# Patient Record
Sex: Male | Born: 1980 | Race: White | Hispanic: No | Marital: Single | Smoking: Current every day smoker
Health system: Southern US, Community
[De-identification: ages and names within clinical notes are randomized; demographics above are authoritative.]

## PROBLEM LIST (undated history)

## (undated) DIAGNOSIS — F319 Bipolar disorder, unspecified: Secondary | ICD-10-CM

---

## 2015-03-06 ENCOUNTER — Ambulatory Visit (INDEPENDENT_AMBULATORY_CARE_PROVIDER_SITE_OTHER): Payer: Self-pay | Admitting: Urgent Care

## 2015-03-06 ENCOUNTER — Encounter: Payer: Self-pay | Admitting: Urgent Care

## 2015-03-06 VITALS — BP 110/70 | HR 79 | Temp 98.3°F | Resp 16 | Ht 69.0 in | Wt 153.4 lb

## 2015-03-06 DIAGNOSIS — Z021 Encounter for pre-employment examination: Secondary | ICD-10-CM

## 2015-03-06 DIAGNOSIS — Z024 Encounter for examination for driving license: Secondary | ICD-10-CM

## 2015-03-06 NOTE — Patient Instructions (Signed)

## 2015-03-06 NOTE — Progress Notes (Signed)
Airline pilot Medical Examination   Mitchell Mckee is a 35 y.o. male who presents today for a DOT physical exam. The patient reports no problems. Patient smokes 1ppd, denies chest pain, shob, wheezing, history of COPD.  The following portions of the patient's history were reviewed and updated as appropriate: allergies, current medications, past family history, past medical history, past social history and past surgical history.  Objective:   BP 110/70 mmHg  Pulse 79  Temp(Src) 98.3 F (36.8 C) (Oral)  Resp 16  Ht  (1.753 m)  Wt 153 lb 6.4 oz (69.582 kg)  BMI 22.64 kg/m2  SpO2 98%  Vision/hearing:  Visual Acuity Screening   Right eye Left eye Both eyes  Without correction:  With correction:     Comments: Peripheral vision 85 degrees both eyes. Color vision: Normal 6/6.  Hearing Screening Comments: Whisper test both ears at 10 feet.  Applicant can recognize and distinguish among traffic control signals and devices showing standard red, green, and amber colors.  Corrective lenses required: No  Monocular Vision?: No  Hearing aid requirement: No  Physical Exam  Constitutional: He is oriented to person, place, and time. He appears well-developed and well-nourished.  HENT:  TM's intact bilaterally, no effusions or erythema. Nasal turbinates pink and moist, nasal passages patent. No sinus tenderness. Oropharynx clear, mucous membranes moist, dentition in good repair.  Eyes: Conjunctivae and EOM are normal. Pupils are equal, round, and reactive to light. Right eye exhibits no discharge. Left eye exhibits no discharge. No scleral icterus.  Neck: Normal range of motion. Neck supple. No thyromegaly present.  Cardiovascular: Normal rate, regular rhythm and intact distal pulses.  Exam reveals no gallop and no friction rub.   No murmur heard. Pulmonary/Chest: No stridor. No respiratory distress. He has no wheezes. He has no rales.  Abdominal: Soft. Bowel  sounds are normal. He exhibits no distension and no mass. There is no tenderness.  Musculoskeletal: Normal range of motion. He exhibits no edema or tenderness.  Lymphadenopathy:    He has no cervical adenopathy.  Neurological: He is alert and oriented to person, place, and time. He has normal reflexes. Coordination normal.  Skin: Skin is warm and dry. No rash noted. No erythema. No pallor.  Psychiatric: He has a normal mood and affect.    Labs: Comments: Specific Gr: 1.025; Blood-neg; Protein-trace; Glucose-neg  Assessment:    Healthy male exam.  Meets standards in 14 CFR 391.41;  qualifies for 2 year certificate.    Plan:    Medical examiners certificate completed and printed. Return as needed.

## 2016-07-28 ENCOUNTER — Emergency Department (HOSPITAL_COMMUNITY)
Admission: EM | Admit: 2016-07-28 | Discharge: 2016-07-30 | Disposition: A | Discharge status: Other Acute Inpatient Hospital | Payer: 59 | Attending: Emergency Medicine | Admitting: Emergency Medicine

## 2016-07-28 DIAGNOSIS — F3113 Bipolar disorder, current episode manic without psychotic features, severe: Secondary | ICD-10-CM | POA: Insufficient documentation

## 2016-07-28 DIAGNOSIS — F17210 Nicotine dependence, cigarettes, uncomplicated: Secondary | ICD-10-CM | POA: Insufficient documentation

## 2016-07-28 DIAGNOSIS — F1220 Cannabis dependence, uncomplicated: Secondary | ICD-10-CM | POA: Insufficient documentation

## 2016-07-28 LAB — COMPREHENSIVE METABOLIC PANEL
ALK PHOS: 47 U/L (ref 38–126)
ALT: 17 U/L (ref 17–63)
ANION GAP: 12 (ref 5–15)
AST: 27 U/L (ref 15–41)
Albumin: 5.1 g/dL — ABNORMAL HIGH (ref 3.5–5.0)
BILIRUBIN TOTAL: 1 mg/dL (ref 0.3–1.2)
BUN: 19 mg/dL (ref 6–20)
CALCIUM: 9.4 mg/dL (ref 8.9–10.3)
CO2: 24 mmol/L (ref 22–32)
CREATININE: 1.22 mg/dL (ref 0.61–1.24)
Chloride: 107 mmol/L (ref 101–111)
GFR calc non Af Amer: >60 mL/min (ref >60)
Glucose, Bld: 130 mg/dL — ABNORMAL HIGH (ref 65–99)
Potassium: 3.5 mmol/L (ref 3.5–5.1)
Sodium: 143 mmol/L (ref 135–145)
TOTAL PROTEIN: 7.7 g/dL (ref 6.5–8.1)

## 2016-07-28 LAB — CBC WITH DIFFERENTIAL/PLATELET
BASOS PCT: 0 %
Basophils Absolute: 0.1 10*3/uL (ref 0.0–0.1)
EOS ABS: 0.2 10*3/uL (ref 0.0–0.7)
EOS PCT: 1 %
HCT: 45.5 % (ref 39.0–52.0)
HEMOGLOBIN: 16 g/dL (ref 13.0–17.0)
Lymphocytes Relative: 17 %
Lymphs Abs: 2.4 10*3/uL (ref 0.7–4.0)
MCH: 30.8 pg (ref 26.0–34.0)
MCHC: 35.2 g/dL (ref 30.0–36.0)
MCV: 87.5 fL (ref 78.0–100.0)
MONOS PCT: 10 %
Monocytes Absolute: 1.3 10*3/uL — ABNORMAL HIGH (ref 0.1–1.0)
NEUTROS PCT: 72 %
Neutro Abs: 9.8 10*3/uL — ABNORMAL HIGH (ref 1.7–7.7)
PLATELETS: 262 10*3/uL (ref 150–400)
RBC: 5.2 MIL/uL (ref 4.22–5.81)
RDW: 13.2 % (ref 11.5–15.5)
WBC: 13.7 10*3/uL — AB (ref 4.0–10.5)

## 2016-07-28 LAB — ETHANOL: Alcohol, Ethyl (B): <5 mg/dL (ref <5)

## 2016-07-28 MED ORDER — ZIPRASIDONE MESYLATE 20 MG IM SOLR
10.0000 mg | Freq: Once | INTRAMUSCULAR | Status: AC
  Administered 2016-07-28: 10 mg via INTRAMUSCULAR
  Filled 2016-07-28: qty 20

## 2016-07-28 MED ORDER — STERILE WATER FOR INJECTION IJ SOLN
INTRAMUSCULAR | Status: AC
  Administered 2016-07-28
  Filled 2016-07-28: qty 10

## 2016-07-28 NOTE — ED Triage Notes (Signed)
Pt behavior change over the last several days very focused on video games. Pt has not slept for several days currently hyper verbal with flight of ideas.

## 2016-07-29 DIAGNOSIS — F1220 Cannabis dependence, uncomplicated: Secondary | ICD-10-CM | POA: Diagnosis present

## 2016-07-29 DIAGNOSIS — F3113 Bipolar disorder, current episode manic without psychotic features, severe: Secondary | ICD-10-CM | POA: Diagnosis present

## 2016-07-29 LAB — RAPID URINE DRUG SCREEN, HOSP PERFORMED
AMPHETAMINES: NOT DETECTED
BENZODIAZEPINES: NOT DETECTED
Barbiturates: NOT DETECTED
Cocaine: NOT DETECTED
Opiates: NOT DETECTED
TETRAHYDROCANNABINOL: POSITIVE — AB

## 2016-07-29 LAB — URINALYSIS, ROUTINE W REFLEX MICROSCOPIC
Bilirubin Urine: NEGATIVE
GLUCOSE, UA: NEGATIVE mg/dL
HGB URINE DIPSTICK: NEGATIVE
Ketones, ur: 20 mg/dL — AB
Leukocytes, UA: NEGATIVE
NITRITE: NEGATIVE
PROTEIN: 30 mg/dL — AB
SPECIFIC GRAVITY, URINE: 1.032 — AB (ref 1.005–1.030)
Squamous Epithelial / LPF: NONE SEEN
pH: 5 (ref 5.0–8.0)

## 2016-07-29 LAB — TSH: TSH: 1.648 u[IU]/mL (ref 0.350–4.500)

## 2016-07-29 MED ORDER — RISPERIDONE 0.5 MG PO TABS
0.5000 mg | ORAL_TABLET | Freq: Two times a day (BID) | ORAL | Status: DC
  Administered 2016-07-29 – 2016-07-30 (×3): 0.5 mg via ORAL
  Filled 2016-07-29 (×3): qty 1

## 2016-07-29 MED ORDER — NICOTINE 21 MG/24HR TD PT24
21.0000 mg | MEDICATED_PATCH | Freq: Every day | TRANSDERMAL | Status: DC
  Administered 2016-07-29 – 2016-07-30 (×2): 21 mg via TRANSDERMAL
  Filled 2016-07-29 (×2): qty 1

## 2016-07-29 MED ORDER — GABAPENTIN 100 MG PO CAPS
200.0000 mg | ORAL_CAPSULE | Freq: Two times a day (BID) | ORAL | Status: DC
  Administered 2016-07-29 – 2016-07-30 (×3): 200 mg via ORAL
  Filled 2016-07-29 (×3): qty 2

## 2016-07-29 MED ORDER — HYDROXYZINE HCL 25 MG PO TABS
50.0000 mg | ORAL_TABLET | Freq: Three times a day (TID) | ORAL | Status: DC | PRN
  Administered 2016-07-29 – 2016-07-30 (×2): 50 mg via ORAL
  Filled 2016-07-29 (×2): qty 2

## 2016-07-29 MED ORDER — ASENAPINE MALEATE 5 MG SL SUBL
10.0000 mg | SUBLINGUAL_TABLET | Freq: Two times a day (BID) | SUBLINGUAL | Status: DC | PRN
  Administered 2016-07-29: 10 mg via SUBLINGUAL
  Filled 2016-07-29: qty 2

## 2016-07-29 NOTE — ED Notes (Signed)
Pt compliant with prescribed medication regimen. Pt speech pressured, tangential, rapid, flight of ideas. Pt restless. Encouragement and support provided. Special checks q 15 mins in place for safety, Video monitoring in place. Will continue to monitor.

## 2016-07-29 NOTE — Progress Notes (Signed)
07/29/2016 Approximately 1:20 LRT asked pt if he would like to participate in an activity in the dayroom, pt was interested. Pt joined LRT and intern in dayroom. LRT took out cards and [place], but pt wanted to talk about what brought him in here. Pt was hyper verbal and manic. Pt reported he should not be in here and that he was setting up his life for his girlfriend and his future baby. Pt reported he sent a text to his girlfriend's mother and father, but did not specify what it had said, but they showed up at his house. Pt reported that he came here for a drug test because his friends and family believe he is using drugs and then he ended up in here. Pt reported that he only smokes marijuana. Pt appeared aggravated with his family and reports that he is lonely. 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 Pt reports he is ready to go home and he will continue to "agro" the staff. Pt spoke to LRT and intern and LRT suggested that he should speak to his nurse to find out what the plans are for him regarding his discharge. Pt was respectful with LRT and intern, but spent his session speaking about his current situation and his emotions regarding the position he is in.  Marvell Fullerachel Meyer, Recreation Therapy Intern  Mitchell RancherMarjette Cheryel Mckee, LRT/CTRS

## 2016-07-29 NOTE — ED Notes (Signed)
Pt observed in dayroom with RT.  

## 2016-07-29 NOTE — ED Provider Notes (Signed)
TSH normal Pt resting Will transfer to psych    Zadie RhineWickline, Sharne Linders, MD 07/29/16 0230

## 2016-07-29 NOTE — ED Notes (Addendum)
Medication not effective at slowing pt maina down. Pt hyper focused on "smoking weed"

## 2016-07-29 NOTE — ED Notes (Signed)
Pt making inappropriate comments towards this nurse. Pt counseled in regards to behavior. Verbalizes understanding.

## 2016-07-29 NOTE — ED Notes (Signed)
Pts mother called demanding to speak to the doctor. This writer could not confirm or deny if the pt was here, mother continued to demand to speak with doctor or nurse. This Clinical research associatewriter continued to tell mother that I could not confirm or deny if patient was here due to [employer] HIPPA laws, but this writer could take a message and give it to the patient IF the patient WAS here. Mother continued to demand she speak with a nurse or doctor who was caring for her son and became verbally aggressive stating "I know my son is there and he is in no mental state to make any decisions! This is outrageous!" This Clinical research associatewriter took message for patient; told patient and patient stated "I dont want to speak with her. I dont want her to know im here! I will speak with anyone BUT her!" Mothers Name: Jarold SongJulia Pilcher Number: [phone]

## 2016-07-29 NOTE — BH Assessment (Signed)
[facility] Assessment Progress Note  Per Thedore MinsMojeed Akintayo, MD, this pt requires psychiatric hospitalization.  He also finds that pt meets criteria for IVC, which he has initiated.  [place] [facility] [place] [facility], and at [employer]12:15 Magistrate Morton confirms receipt.  As of this writing, service of Findings and Custody Order is pending.  Malva LimesLinsey Strader, RN, [id number]; they will call when they are ready to receive pt.  Pt's nurse, Morrie Sheldonshley, has been notified, and agrees to call report to [phone].  Pt is to be transported via Patent examinerlaw enforcement.   Doylene Canninghomas Cherokee Boccio, MA Triage Specialist [phone]

## 2016-07-29 NOTE — ED Provider Notes (Signed)
[facility]-EMERGENCY DEPT Provider Note   CSN: [id number] Arrival date & time: 07/28/16  2039     History   Chief Complaint Chief Complaint  Patient presents with  . Altered Mental Status   Level V caveat due to psychiatric disorder. HPI Mitchell Mckee is a 36 y.o. male.  HPI Patient presents brought in for psychiatric issues. Reportedly has only slept around 12 hours this week. Has been very focused on video games. Flight of ideas. Reportedly had previous psychiatric. States he smokes marijuana but denies other drug use. Did have one previous involuntary commitment. This was reportedly years ago after smoking some ecstasy and having a bad reaction. Patient states he is just aggressive. Very difficult to get the history from due to his flight of ideas.   No past medical history on file.  There are no active problems to display for this patient.   No past surgical history on file.     Home Medications    Prior to Admission medications   Not on File    Family History No family history on file.  Social History Social History  Substance Use Topics  . Smoking status: Current Every Day Smoker  . Smokeless tobacco: Not on file  . Alcohol use No     Allergies   Patient has no known allergies.   Review of Systems Review of Systems  Unable to perform ROS: Psychiatric disorder  Psychiatric/Behavioral: Negative for suicidal ideas.     Physical Exam Updated Vital Signs BP (!) 159/91 (BP Location: Right Arm)   Pulse (!) 107   Temp 97.9 F (36.6 C) (Oral)   Resp 18   SpO2 100%   Physical Exam  Constitutional: He is oriented to person, place, and time. He appears well-developed.  HENT:  Head: Atraumatic.  Neck: Neck supple.  Cardiovascular: Normal rate.   Pulmonary/Chest: Effort normal.  Abdominal: Soft. There is no tenderness.  Musculoskeletal: He exhibits no edema.  Neurological: He is alert and oriented to person, place, and time.  Skin: Skin is warm.    Psychiatric:  Patient very pressured speech. Talking the video games. Appears somewhat agitated. Does not appear to be responding to internal stimuli.     ED Treatments / Results  Labs (all labs ordered are listed, but only abnormal results are displayed) Labs Reviewed  COMPREHENSIVE METABOLIC PANEL - Abnormal; Notable for the following:       Result Value   Glucose, Bld 130 (*)    Albumin 5.1 (*)    All other components within normal limits  CBC WITH DIFFERENTIAL/PLATELET - Abnormal; Notable for the following:    WBC 13.7 (*)    Neutro Abs 9.8 (*)    Monocytes Absolute 1.3 (*)    All other components within normal limits  ETHANOL  RAPID URINE DRUG SCREEN, HOSP PERFORMED  URINALYSIS, ROUTINE W REFLEX MICROSCOPIC  TSH    EKG  EKG Interpretation None       Radiology No results found.  Procedures Procedures (including critical care time)  Medications Ordered in ED Medications  sterile water (preservative free) injection (not administered)  ziprasidone (GEODON) injection 10 mg (10 mg Intramuscular Given 07/28/16 2358)     Initial Impression / Assessment and Plan / ED Course  I have reviewed the triage vital signs and the nursing notes.  Pertinent labs & imaging results that were available during my care of the patient were reviewed by me and considered in my medical decision making (see chart for  details).     Patient appears somewhat manic. Reportedly has slept very little in the point video games. After discussion with patient's wife he also just recently found out that she was pregnant. Has slight psych history in the past. Agitated required some Geodon. Urinalysis and drug screen still pending but otherwise labs reassuring. Care will be turned over to Dr. Bebe Shaggy.  Final Clinical Impressions(s) / ED Diagnoses   Final diagnoses:  [facility])    New Prescriptions New Prescriptions   No medications on file     Benjiman Core, MD 07/29/16 0006
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
## 2016-07-29 NOTE — ED Notes (Signed)
Pt stated that he had a cell phone with him when he came to the hospital. There was no phone found in his personal belongings bag. He came from TCU rm 26. Nurse from TCU stated for me to ask security.Called security and they stated that they didn't have a cell phone checked in for the pt. All belongings received by [facility] is documented on pt belongings sheet. In the chart.

## 2016-07-29 NOTE — ED Notes (Signed)
[facility] due to a facilities issue.

## 2016-07-29 NOTE — ED Notes (Signed)
Patient admitted to unit. Patient is anxious, and hyper verbal. Patient has flight of ideas and loose associations when he communicates with this Clinical research associatewriter. Patient search completed no contraband found. Patient currently sleeping at this time will continue to monitor. Denies SI.

## 2016-07-29 NOTE — ED Notes (Signed)
SBAR Report received from previous nurse. Pt received calm and visible on unit. Pt denies current SI/ HI, A/V H, depression, anxiety, or pain at this time, and appears otherwise stable and free of distress but is severely hypermanic and beaming with excitement over everything.. Pt reminded of camera surveillance, q 15 min rounds, and rules of the milieu. Will continue to assess.

## 2016-07-30 ENCOUNTER — Encounter (HOSPITAL_COMMUNITY): Payer: Self-pay | Admitting: *Deleted

## 2016-07-30 ENCOUNTER — Encounter (HOSPITAL_COMMUNITY): Payer: Self-pay | Admitting: Registered Nurse

## 2016-07-30 ENCOUNTER — Inpatient Hospital Stay (HOSPITAL_COMMUNITY)
Admission: AD | Admit: 2016-07-30 | Discharge: 2016-08-03 | DRG: 885 | Disposition: A | Discharge status: Home / Self Care | Payer: 59 | Source: Intra-hospital | Attending: Psychiatry | Admitting: Psychiatry

## 2016-07-30 DIAGNOSIS — F17210 Nicotine dependence, cigarettes, uncomplicated: Secondary | ICD-10-CM

## 2016-07-30 DIAGNOSIS — F419 Anxiety disorder, unspecified: Secondary | ICD-10-CM | POA: Diagnosis present

## 2016-07-30 DIAGNOSIS — F319 Bipolar disorder, unspecified: Secondary | ICD-10-CM | POA: Diagnosis present

## 2016-07-30 DIAGNOSIS — F3110 Bipolar disorder, current episode manic without psychotic features, unspecified: Secondary | ICD-10-CM | POA: Diagnosis not present

## 2016-07-30 DIAGNOSIS — F3113 Bipolar disorder, current episode manic without psychotic features, severe: Secondary | ICD-10-CM

## 2016-07-30 DIAGNOSIS — G4700 Insomnia, unspecified: Secondary | ICD-10-CM | POA: Diagnosis present

## 2016-07-30 DIAGNOSIS — F1220 Cannabis dependence, uncomplicated: Secondary | ICD-10-CM

## 2016-07-30 DIAGNOSIS — F39 Unspecified mood [affective] disorder: Secondary | ICD-10-CM | POA: Diagnosis not present

## 2016-07-30 MED ORDER — HYDROXYZINE HCL 25 MG PO TABS: 50.0000 mg | ORAL_TABLET | Freq: Three times a day (TID) | ORAL | Status: DC

## 2016-07-30 MED ORDER — GABAPENTIN 100 MG PO CAPS
200.0000 mg | ORAL_CAPSULE | Freq: Two times a day (BID) | ORAL | Status: DC
  Administered 2016-07-30 – 2016-07-31 (×2): 200 mg via ORAL
  Filled 2016-07-30 (×5): qty 2

## 2016-07-30 MED ORDER — HYDROXYZINE HCL 50 MG PO TABS
50.0000 mg | ORAL_TABLET | Freq: Three times a day (TID) | ORAL | Status: DC
  Administered 2016-07-30 – 2016-08-03 (×12): 50 mg via ORAL
  Filled 2016-07-30 (×20): qty 1

## 2016-07-30 MED ORDER — ALUM & MAG HYDROXIDE-SIMETH 200-200-20 MG/5ML PO SUSP: 30.0000 mL | ORAL | Status: DC | PRN

## 2016-07-30 MED ORDER — MAGNESIUM HYDROXIDE 400 MG/5ML PO SUSP: 30.0000 mL | Freq: Every day | ORAL | Status: DC | PRN

## 2016-07-30 MED ORDER — ACETAMINOPHEN 325 MG PO TABS: 650.0000 mg | ORAL_TABLET | Freq: Four times a day (QID) | ORAL | Status: DC | PRN

## 2016-07-30 MED ORDER — NICOTINE 21 MG/24HR TD PT24
21.0000 mg | MEDICATED_PATCH | Freq: Every day | TRANSDERMAL | Status: DC
  Administered 2016-07-31 – 2016-08-03 (×4): 21 mg via TRANSDERMAL
  Filled 2016-07-30 (×6): qty 1

## 2016-07-30 MED ORDER — BENZTROPINE MESYLATE 0.5 MG PO TABS
0.5000 mg | ORAL_TABLET | Freq: Two times a day (BID) | ORAL | Status: DC
  Administered 2016-07-30 – 2016-07-31 (×2): 0.5 mg via ORAL
  Filled 2016-07-30 (×5): qty 1

## 2016-07-30 MED ORDER — ASENAPINE MALEATE 5 MG SL SUBL
10.0000 mg | SUBLINGUAL_TABLET | Freq: Two times a day (BID) | SUBLINGUAL | Status: DC | PRN
  Administered 2016-07-30: 10 mg via SUBLINGUAL
  Filled 2016-07-30: qty 2

## 2016-07-30 MED ORDER — RISPERIDONE 1 MG PO TABS
1.0000 mg | ORAL_TABLET | Freq: Two times a day (BID) | ORAL | Status: DC
  Administered 2016-07-30: 1 mg via ORAL
  Filled 2016-07-30 (×3): qty 1

## 2016-07-30 MED ORDER — RISPERIDONE 1 MG PO TABS: 1.0000 mg | ORAL_TABLET | Freq: Two times a day (BID) | ORAL | Status: DC

## 2016-07-30 MED ORDER — BENZTROPINE MESYLATE 0.5 MG PO TABS
0.5000 mg | ORAL_TABLET | Freq: Two times a day (BID) | ORAL | Status: DC
  Administered 2016-07-30: 0.5 mg via ORAL
  Filled 2016-07-30: qty 1

## 2016-07-30 NOTE — ED Notes (Signed)
[facility] on unit to transfer pt to [facility] Adult unit per MD order. Pt signed for personal property and property given to [employer] for transfer. Pt ambulatory off unit in police custody.  
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
## 2016-07-30 NOTE — ED Notes (Signed)
Pt compliant with medication regimen. Pt speech rapid, pressured, flight of ideas. Pt intrusive, poor boundaries noted. Observed pacing the hallway throughout the shift. Special checks q 15 mins in place for safety, Video monitoring in place. Will continue to monitor.

## 2016-07-30 NOTE — Progress Notes (Signed)
Pt has been hyper-verbal this evening with flight of ideas and language that is frequently violent. Pt has mentioned owning several guns and having "enough ammo to blow my whole family away." Pt has also mentioned several times being willing to "put a gun to someone's head if that's what is needed."  Alycia RossettiRyan has been unable to address these statements further once making them and quickly moves on to other topics without clarification of his concerning comments. Pt's RN has been notified. The Writer will continue to monitor the Pt providing support and redirection as needed.

## 2016-07-30 NOTE — Tx Team (Signed)
Initial Treatment Plan 07/30/2016 11:16 PM Mitchell CongressRyan T Mckee [id number]    PATIENT STRESSORS: Marital or family conflict   PATIENT STRENGTHS: Communication skills Physical Health Supportive family/friends   PATIENT IDENTIFIED PROBLEMS: Psychosis  "I just want my family to listen to me"  "I want to go home and smoke weed and cigarettes"                 DISCHARGE CRITERIA:  Ability to meet basic life and health needs Improved stabilization in mood, thinking, and/or behavior Motivation to continue treatment in a less acute level of care Need for constant or close observation no longer present Verbal commitment to aftercare and medication compliance  PRELIMINARY DISCHARGE PLAN: Outpatient therapy Return to previous living arrangement Return to previous work or school arrangements  PATIENT/FAMILY INVOLVEMENT: This treatment plan has been presented to and reviewed with the patient, Mitchell MageRyan T Mckee.  The patient and family have been given the opportunity to ask questions and make suggestions.  Carleene OverlieMiddleton, Derika Eckles P, RN 07/30/2016, 11:16 PM

## 2016-07-30 NOTE — Consult Note (Signed)
[facility] Consult   Reason for Consult:  Delusional Referring Physician:  EDP Patient Identification: Mitchell Mckee MRN:  [id number] Principal Diagnosis: Bipolar affective disorder, manic, severe ([place]) Diagnosis:   Patient Active Problem List   Diagnosis Date Noted  . Bipolar affective disorder, manic, severe ([place]) [F31.13] 07/29/2016  . Cannabis use disorder, severe, dependence ([place]) [F12.20] 07/29/2016    Total Time spent with patient: 30 minutes  Subjective:   Mitchell Mckee is a 36 y.o. male patient presents to [facility] voluntary stating he has no control and asking for help.  HPI:  36 y.o., male presented to [facility] voluntary with complaints that he needs help.  Patient reported that he was under a social experiment which caused a lot of trouble. Today patient is pacing hall, restless, but calm.  Couldn't get patient to stop pacing.  Nursing reports that patient has been pacing all morning but very intrusive of other.  There has been no behavioral outburst.    Past Psychiatric History: Bipolar 1 Disorder  Risk to Self: Suicidal Ideation: No (Pt denies. ) Suicidal Intent: No Is patient at risk for suicide?: No Suicidal Plan?: No Access to Means: No What has been your use of drugs/alcohol within the last 12 months?: Pt's UDS is pending.  How many times?: 0 Other Self Harm Risks: Pt denies.  Triggers for Past Attempts: None known Intentional Self Injurious Behavior: None (Pt denies. ) Risk to Others: Homicidal Ideation: No (Pt denies. ) Thoughts of Harm to Others: No Current Homicidal Intent: No Current Homicidal Plan: No Access to Homicidal Means: No Identified Victim: NA History of harm to others?: No Assessment of Violence: None Noted Violent Behavior Description: NA Does patient have access to weapons?: No (Pt denies. ) Criminal Charges Pending?: No Does patient have a court date: No Prior Inpatient Therapy: Prior Inpatient Therapy: No Prior Therapy  Dates: NA Prior Therapy Facilty/Provider(s): NA Reason for Treatment: NA Prior Outpatient Therapy: Prior Outpatient Therapy: No Prior Therapy Dates: NA Prior Therapy Facilty/Provider(s): NA Reason for Treatment: NA Does patient have an ACCT team?: No Does patient have Intensive In-House Services?  : No Does patient have [facility] services? : No Does patient have P4CC services?: No  Past Medical History: History reviewed. No pertinent past medical history. History reviewed. No pertinent surgical history. Family History: History reviewed. No pertinent family history. Family Psychiatric  History: Unaware Social History:  History  Alcohol Use No     History  Drug Use No    Social History   Social History  . Marital status: Single    Spouse name: N/A  . Number of children: N/A  . Years of education: N/A   Social History Main Topics  . Smoking status: Current Every Day Smoker  . Smokeless tobacco: None  . Alcohol use No  . Drug use: No  . Sexual activity: Not Asked   Other Topics Concern  . None   Social History Narrative  . None   Additional Social History:    Allergies:  No Known Allergies  Labs:  Results for orders placed or performed during the hospital encounter of 07/28/16 (from the past 48 hour(s))  Comprehensive metabolic panel     Status: Abnormal   Collection Time: 07/28/16  9:13 PM  Result Value Ref Range   Sodium 143 135 - 145 mmol/L   Potassium 3.5 3.5 - 5.1 mmol/L   Chloride 107 101 - 111 mmol/L   CO2 24 22 - 32 mmol/L  Glucose, Bld 130 (H) 65 - 99 mg/dL   BUN 19 6 - 20 mg/dL   Creatinine, Ser 1.22 0.61 - 1.24 mg/dL   Calcium 9.4 8.9 - 10.3 mg/dL   Total Protein 7.7 6.5 - 8.1 g/dL   Albumin 5.1 (H) 3.5 - 5.0 g/dL   AST 27 15 - 41 U/L   ALT 17 17 - 63 U/L   Alkaline Phosphatase 47 38 - 126 U/L   Total Bilirubin 1.0 0.3 - 1.2 mg/dL   GFR calc non Af Amer >60 >60 mL/min   GFR calc Af Amer >60 >60 mL/min    Comment: (NOTE) The eGFR has been  calculated using the CKD EPI equation. This calculation has not been validated in all clinical situations. eGFR's persistently <60 mL/min signify possible Chronic Kidney Disease.    Anion gap 12 5 - 15  Ethanol     Status: None   Collection Time: 07/28/16  9:13 PM  Result Value Ref Range   Alcohol, Ethyl (B) <5 <5 mg/dL    Comment:        LOWEST DETECTABLE LIMIT FOR SERUM ALCOHOL IS 5 mg/dL FOR MEDICAL PURPOSES ONLY   CBC with Diff     Status: Abnormal   Collection Time: 07/28/16  9:13 PM  Result Value Ref Range   WBC 13.7 (H) 4.0 - 10.5 K/uL   RBC 5.20 4.22 - 5.81 MIL/uL   Hemoglobin 16.0 13.0 - 17.0 g/dL   HCT 45.5 39.0 - 52.0 %   MCV 87.5 78.0 - 100.0 fL   MCH 30.8 26.0 - 34.0 pg   MCHC 35.2 30.0 - 36.0 g/dL   RDW 13.2 11.5 - 15.5 %   Platelets 262 150 - 400 K/uL   Neutrophils Relative % 72 %   Neutro Abs 9.8 (H) 1.7 - 7.7 K/uL   Lymphocytes Relative 17 %   Lymphs Abs 2.4 0.7 - 4.0 K/uL   Monocytes Relative 10 %   Monocytes Absolute 1.3 (H) 0.1 - 1.0 K/uL   Eosinophils Relative 1 %   Eosinophils Absolute 0.2 0.0 - 0.7 K/uL   Basophils Relative 0 %   Basophils Absolute 0.1 0.0 - 0.1 K/uL  TSH     Status: None   Collection Time: 07/29/16 12:59 AM  Result Value Ref Range   TSH 1.648 0.350 - 4.500 uIU/mL    Comment: Performed by a 3rd Generation assay with a functional sensitivity of <=0.01 uIU/mL.  Urine rapid drug screen (hosp performed)     Status: Abnormal   Collection Time: 07/29/16  7:50 AM  Result Value Ref Range   Opiates NONE DETECTED NONE DETECTED   Cocaine NONE DETECTED NONE DETECTED   Benzodiazepines NONE DETECTED NONE DETECTED   Amphetamines NONE DETECTED NONE DETECTED   Tetrahydrocannabinol POSITIVE (A) NONE DETECTED   Barbiturates NONE DETECTED NONE DETECTED    Comment:        DRUG SCREEN FOR MEDICAL PURPOSES ONLY.  IF CONFIRMATION IS NEEDED FOR ANY PURPOSE, NOTIFY LAB WITHIN 5 DAYS.        LOWEST DETECTABLE LIMITS FOR URINE DRUG SCREEN Drug  Class       Cutoff (ng/mL) Amphetamine      1000 Barbiturate      200 Benzodiazepine   355 Tricyclics       732 Opiates          300 Cocaine          300 THC  50   Urinalysis, Routine w reflex microscopic     Status: Abnormal   Collection Time: 07/29/16  7:50 AM  Result Value Ref Range   Color, Urine YELLOW YELLOW   APPearance CLEAR CLEAR   Specific Gravity, Urine 1.032 (H) 1.005 - 1.030   pH 5.0 5.0 - 8.0   Glucose, UA NEGATIVE NEGATIVE mg/dL   Hgb urine dipstick NEGATIVE NEGATIVE   Bilirubin Urine NEGATIVE NEGATIVE   Ketones, ur 20 (A) NEGATIVE mg/dL   Protein, ur 30 (A) NEGATIVE mg/dL   Nitrite NEGATIVE NEGATIVE   Leukocytes, UA NEGATIVE NEGATIVE   RBC / HPF 0-5 0 - 5 RBC/hpf   WBC, UA 0-5 0 - 5 WBC/hpf   Bacteria, UA RARE (A) NONE SEEN   Squamous Epithelial / LPF NONE SEEN NONE SEEN   Mucous PRESENT     Current Facility-Administered Medications  Medication Dose Route Frequency Provider Last Rate Last Dose  . asenapine (SAPHRIS) sublingual tablet 10 mg  10 mg Sublingual Q12H PRN Thedore Mins, MD   10 mg at 07/29/16 2052  . gabapentin (NEURONTIN) capsule 200 mg  200 mg Oral BID Thedore Mins, MD   200 mg at 07/30/16 0904  . hydrOXYzine (ATARAX/VISTARIL) tablet 50 mg  50 mg Oral TID PRN Charm Rings, NP   50 mg at 07/30/16 0904  . nicotine (NICODERM CQ - dosed in mg/24 hours) patch 21 mg  21 mg Transdermal Daily Charm Rings, NP   21 mg at 07/30/16 0905  . risperiDONE (RISPERDAL) tablet 0.5 mg  0.5 mg Oral BID Jannifer Franklin, Mojeed, MD   0.5 mg at 07/30/16 9950   No current outpatient prescriptions on file.    Musculoskeletal: Strength & Muscle Tone: within normal limits Gait & Station: normal Patient leans: N/A  Psychiatric Specialty Exam: Physical Exam  Constitutional: He is oriented to person, place, and time.  Neck: Normal range of motion.  Respiratory: Effort normal.  Musculoskeletal: Normal range of motion.  Neurological: He is alert and  oriented to person, place, and time.  Psychiatric: His mood appears anxious. He expresses impulsivity.    ROS  Blood pressure (!) 146/103, pulse (!) 118, temperature 99 F (37.2 C), temperature source Oral, resp. rate 20, SpO2 100 %.There is no height or weight on file to calculate BMI.  General Appearance: Fairly Groomed  Eye Contact:  Minimal  Speech:  Pressured  Volume:  Normal  Mood:  Anxious  Affect:  Labile  Thought Process:  Disorganized  Orientation:  Full (Time, Place, and Person)  Thought Content:  Unable to determine at this time because patient would not speak  Suicidal Thoughts:  No  Homicidal Thoughts:  No  Memory:  Unable to determine  Judgement:  Impaired  Insight:  Unable to determine  Psychomotor Activity:  Restlessness  Concentration:  Concentration: Fair and Attention Span: Fair  Recall:  Unable to determine  Fund of Knowledge:  Fair  Language:  Good  Akathisia:  No  Handed:  Right  AIMS (if indicated):     Assets:  Desire for Improvement  ADL's:  Intact  Cognition:  WNL  Sleep:        Treatment Plan Summary: Daily contact with patient to assess and evaluate symptoms and progress in treatment and Medication management   Medication Management: Saphris 10 mg Q 12 hr prn agitation Cogentin 0.5 mg Bid for EPS Increase Risperdal 1 mg Bid for mood stabilization Change Vistaril 50 mg Tid for anxiety and akathesia Gabapentin  200 mg Bid for restlessness and agitation  Disposition: Recommend psychiatric Inpatient admission when medically cleared.   Patient has been accepted to [facility] as soon as room is available  Rankin, Delphia Grates, NP 07/30/2016 11:12 AM   Patient seen, chart reviewed for this face-to-face psychiatric consultation and evaluation and case discussed with physician extender, and formulated treatment plan. Reviewed the information documented and agree with the treatment plan.  Rino Hosea [facility] 07/30/2016 2:58 PM

## 2016-07-30 NOTE — ED Notes (Signed)
Pt taking a shower 

## 2016-07-30 NOTE — Progress Notes (Signed)
Adult Psychoeducational Group Note  Date:  07/30/2016 Time:  8:47 PM  Group Topic/Focus:  Wrap-Up Group:   The focus of this group is to help patients review their daily goal of treatment and discuss progress on daily workbooks.  Participation Level:  Minimal  Participation Quality:  Inattentive  Affect:  Labile  Cognitive:  Lacking  Insight: None  Engagement in Group:  Off Topic  Modes of Intervention:  Discussion  Additional Comments:  The attend wrap up group but no other groups.  [name], Marbin Olshefski Lee 07/30/2016, 8:47 PM

## 2016-07-31 DIAGNOSIS — F1220 Cannabis dependence, uncomplicated: Secondary | ICD-10-CM

## 2016-07-31 DIAGNOSIS — F17210 Nicotine dependence, cigarettes, uncomplicated: Secondary | ICD-10-CM

## 2016-07-31 DIAGNOSIS — G4700 Insomnia, unspecified: Secondary | ICD-10-CM

## 2016-07-31 DIAGNOSIS — F3110 Bipolar disorder, current episode manic without psychotic features, unspecified: Secondary | ICD-10-CM

## 2016-07-31 MED ORDER — RISPERIDONE 1 MG PO TABS: 1.0000 mg | ORAL_TABLET | Freq: Two times a day (BID) | ORAL | Status: DC | PRN

## 2016-07-31 MED ORDER — RISPERIDONE 3 MG PO TABS
3.0000 mg | ORAL_TABLET | Freq: Every day | ORAL | Status: DC
  Administered 2016-08-01 – 2016-08-02 (×2): 3 mg via ORAL
  Filled 2016-07-31 (×4): qty 1

## 2016-07-31 MED ORDER — LORAZEPAM 1 MG PO TABS
1.0000 mg | ORAL_TABLET | Freq: Once | ORAL | Status: AC
  Administered 2016-07-31: 1 mg via ORAL
  Filled 2016-07-31: qty 1

## 2016-07-31 MED ORDER — RISPERIDONE 2 MG PO TABS
2.0000 mg | ORAL_TABLET | Freq: Every day | ORAL | Status: AC
  Administered 2016-07-31: 2 mg via ORAL
  Filled 2016-07-31: qty 1

## 2016-07-31 MED ORDER — BENZTROPINE MESYLATE 1 MG PO TABS
1.0000 mg | ORAL_TABLET | Freq: Every day | ORAL | Status: DC
  Administered 2016-08-01 – 2016-08-02 (×2): 1 mg via ORAL
  Filled 2016-07-31 (×4): qty 1

## 2016-07-31 MED ORDER — TRAZODONE HCL 100 MG PO TABS
100.0000 mg | ORAL_TABLET | Freq: Once | ORAL | Status: AC
  Administered 2016-07-31: 100 mg via ORAL
  Filled 2016-07-31 (×2): qty 1

## 2016-07-31 MED ORDER — DIVALPROEX SODIUM ER 250 MG PO TB24
750.0000 mg | ORAL_TABLET | Freq: Every day | ORAL | Status: DC
  Administered 2016-07-31 – 2016-08-02 (×3): 750 mg via ORAL
  Filled 2016-07-31 (×6): qty 3

## 2016-07-31 MED ORDER — RISPERIDONE 2 MG PO TABS: 2.0000 mg | ORAL_TABLET | Freq: Every day | ORAL | Status: DC

## 2016-07-31 MED ORDER — RISPERIDONE 1 MG PO TABS
1.0000 mg | ORAL_TABLET | Freq: Two times a day (BID) | ORAL | Status: DC
  Administered 2016-07-31: 1 mg via ORAL
  Filled 2016-07-31 (×3): qty 1

## 2016-07-31 NOTE — Progress Notes (Signed)
Patient is with tangential, pressured speech and flight of ideas. Patient is intrusive at the nurses's station and does not respond when staff attempts to redirect. Patient is pacing the unit. Patient verbally agreeable to taking medications. NP Barbara CowerJason notified. New orders received.

## 2016-07-31 NOTE — [facility] Suicide Risk Assessment (Signed)
[facility] Admission Suicide Risk Assessment   Nursing information obtained from:  Patient Demographic factors:  Male, Caucasian, Access to firearms Current Mental Status:  NA Loss Factors:  NA Historical Factors:  Family history of mental illness or substance abuse Risk Reduction Factors:  Employed, Positive social support  Total Time spent with patient: 45 minutes Principal Problem: Bipolar affective disorder, manic ([facility]) Diagnosis:   Patient Active Problem List   Diagnosis Date Noted  . Bipolar affective disorder, manic ([facility]) [F31.10] 07/30/2016  . Cannabis use disorder, severe, dependence [facility]) [F12.20] 07/29/2016   Subjective Data:  Mitchell Mckee is a 36 year old male who originally presented to ED voluntarily with complains that he needs sleep in the setting of marijuana use. Per chart review from consult note at ED, "Nursing reports that patient has been pacing all morning but very intrusive of other." UDS positive for THC only.   Please see H&P for details.  Continued Clinical Symptoms:  Alcohol Use Disorder Identification Test Final Score (AUDIT): 1 The "Alcohol Use Disorders Identification Test", Guidelines for Use in Primary Care, Second Edition.  World Science writer [facility]). Score between 0-7:  no or low risk or alcohol related problems. Score between 8-15:  moderate risk of alcohol related problems. Score between 16-19:  high risk of alcohol related problems. Score 20 or above:  warrants further diagnostic evaluation for alcohol dependence and treatment.   CLINICAL FACTORS:   Alcohol/Substance Abuse/Dependencies, mania   Musculoskeletal: Strength & Muscle Tone: within normal limits Gait & Station: normal Patient leans: N/A  Psychiatric Specialty Exam: Physical Exam  Nursing note and vitals reviewed. agrees with ED exam  Review of Systems  Psychiatric/Behavioral: Positive for substance abuse. Negative for depression, hallucinations and suicidal ideas. The  patient has insomnia. The patient is not nervous/anxious.   All other systems reviewed and are negative.   Blood pressure 119/74, pulse 95, temperature 98.4 F (36.9 C), resp. rate 20, height 5\' 7"  (1.702 m), weight 136 lb (61.7 kg).Body mass index is 21.3 kg/m.  General Appearance: Disheveled  Eye Contact:  Fair  Speech:  Clear and Coherent and Pressured  Volume:  Normal  Mood:  "great"  Affect:  Constricted  Thought Process:  Descriptions of Associations: Tangential  Orientation:  Full (Time, Place, and Person)  Thought Content:  Rumination  Suicidal Thoughts:  No  Homicidal Thoughts:  No  Memory:  difficult to assess due to his mental state  Judgement:  Impaired  Insight:  Lacking  Psychomotor Activity:  Normal  Concentration:  Concentration: Poor and Attention Span: Poor  Recall:  difficult to assess due to his mental state  Fund of Knowledge:  difficult to assess due to his mental state  Language:  Good  Akathisia:  No  Handed:  Right  AIMS (if indicated):     Assets:  Desire for Improvement Physical Health  ADL's:  Intact  Cognition:  WNL  Sleep:         COGNITIVE FEATURES THAT CONTRIBUTE TO RISK:  Closed-mindedness and Polarized thinking    SUICIDE RISK:   Minimal: No identifiable suicidal ideation.  Patients presenting with no risk factors but with morbid ruminations; may be classified as minimal risk based on the severity of the depressive symptoms  PLAN OF CARE:  Patient will be admitted to inpatient psychiatric unit for stabilization and safety. Will provide and encourage milieu participation. Provide medication management and maked adjustments as needed.  Will follow daily.   I certify that inpatient services  furnished can reasonably be expected to improve the patient's condition.   Neysa Hottereina Makenzie Weisner, MD 07/31/2016, 11:27 AM

## 2016-07-31 NOTE — H&P (Signed)
Psychiatric Admission Assessment Adult  Patient Identification: Mitchell Mckee MRN:  [id number] Date of Evaluation:  07/31/2016 Chief Complaint:  BIPOLAR 1 DISORDER MRE MANIC Principal Diagnosis: Bipolar affective disorder, manic (HCC) Diagnosis:   Patient Active Problem List   Diagnosis Date Noted  . Bipolar affective disorder, manic (HCC) [F31.10] 07/30/2016  . Cannabis use disorder, severe, dependence (HCC) [F12.20] 07/29/2016   History of Present Illness:  Mitchell Mckee is a 36 year old male who presented to ED voluntarily with complains that he needs sleep. Per chart review from consult note at ED, "Nursing reports that patient has been pacing all morning but very intrusive of other. "  He is a very poor historian due to tangential thought process, although he is cooperative.  Patient states that he needs help for his mother and his girlfriend at home. When he is asked what kind of help they need, he states that he cannot communicate with them. He wants to "prove it to my family." He then talks about dropping a project and started another project for "them (other people)" who you can earned money by playing video games. He then states that "they (family) have bipolar disorder," although they told him that he is "crazy." He states that he may have seen psychiatrist, although he does not know anybody he can trust. He is unable to elaborate the medication he tried in the past, although he is willing to try anything in this hospital. He feels "great" and he has not slept for a few days at least. He denies feeling depressed. He denies SI, HI, AH/VH. He denies alcohol use. He uses marijuana "as much as possible" every day.   Associated Signs/Symptoms: Depression Symptoms:  denies (Hypo) Manic Symptoms:  Elevated Mood, Flight of Ideas, Grandiosity, Anxiety Symptoms:  denies Psychotic Symptoms:  denies PTSD Symptoms: Difficult to assess due to tangential thought process Total Time spent  with patient: 45 minutes  Past Psychiatric History:  He has seen psychiatrist before, unable to elaborate it, unknown admission  Is the patient at risk to self? No.  Has the patient been a risk to self in the past 6 months? No.  Has the patient been a risk to self within the distant past? No.  Is the patient a risk to others? No.  Has the patient been a risk to others in the past 6 months? No.  Has the patient been a risk to others within the distant past? No.   Prior Inpatient Therapy:   Prior Outpatient Therapy:    Alcohol Screening: 1. How often do you have a drink containing alcohol?: Monthly or less 2. How many drinks containing alcohol do you have on a typical day when you are drinking?: 1 or 2 3. How often do you have six or more drinks on one occasion?: Never Preliminary Score: 0 9. Have you or someone else been injured as a result of your drinking?: No 10. Has a relative or friend or a doctor or another health worker been concerned about your drinking or suggested you cut down?: No Alcohol Use Disorder Identification Test Final Score (AUDIT): 1 Brief Intervention: AUDIT score less than 7 or less-screening does not suggest unhealthy drinking-brief intervention not indicated Substance Abuse History in the last 12 months:  Yes.   Consequences of Substance Abuse: manic episode this time Previous Psychotropic Medications: Unable to elaborate due to his mental state Psychological Evaluations: Unable to elaborate due to his mental state Past Medical History: History reviewed. No  pertinent past medical history. History reviewed. No pertinent surgical history. Family History: History reviewed. No pertinent family history. Family Psychiatric  History:  Unable to elaborate due to his mental state Tobacco Screening: Have you used any form of tobacco in the last 30 days? (Cigarettes, Smokeless Tobacco, Cigars, and/or Pipes): Yes Tobacco use, Select all that apply: 5 or more cigarettes  per day Are you interested in Tobacco Cessation Medications?: No, patient refused Counseled patient on smoking cessation including recognizing danger situations, developing coping skills and basic information about quitting provided: Yes Social History:  History  Alcohol Use No     History  Drug Use No    Additional Social History:      Unable to elaborate due to his mental state                      Allergies:  No Known Allergies Lab Results: No results found for this or any previous visit (from the past 48 hour(s)).  Blood Alcohol level:  Lab Results  Component Value Date   ETH <5 07/28/2016    Metabolic Disorder Labs:  No results found for: HGBA1C, MPG No results found for: PROLACTIN No results found for: CHOL, TRIG, HDL, CHOLHDL, VLDL, LDLCALC  Current Medications: Current Facility-Administered Medications  Medication Dose Route Frequency Provider Last Rate Last Dose  . acetaminophen (TYLENOL) tablet 650 mg  650 mg Oral Q6H PRN Charm RingsLord, Jamison Y, NP      . alum & mag hydroxide-simeth (MAALOX/MYLANTA) 200-200-20 MG/5ML suspension 30 mL  30 mL Oral Q4H PRN Charm RingsLord, Jamison Y, NP      . Melene Muller[START ON 08/01/2016] benztropine (COGENTIN) tablet 1 mg  1 mg Oral QHS Devin Foskey, MD      . divalproex (DEPAKOTE ER) 24 hr tablet 750 mg  750 mg Oral QHS Oma Marzan, MD      . hydrOXYzine (ATARAX/VISTARIL) tablet 50 mg  50 mg Oral TID Rankin, Shuvon B, NP   50 mg at 07/31/16 0826  . magnesium hydroxide (MILK OF MAGNESIA) suspension 30 mL  30 mL Oral Daily PRN Charm RingsLord, Jamison Y, NP      . nicotine (NICODERM CQ - dosed in mg/24 hours) patch 21 mg  21 mg Transdermal Daily Rankin, Shuvon B, NP   21 mg at 07/31/16 0825  . risperiDONE (RISPERDAL) tablet 1 mg  1 mg Oral BID PRN Neysa HotterHisada, Aizik Reh, MD      . risperiDONE (RISPERDAL) tablet 2 mg  2 mg Oral QHS Conan Mcmanaway, Barbee Cougheina, MD      . Melene Muller[START ON 08/01/2016] risperiDONE (RISPERDAL) tablet 3 mg  3 mg Oral QHS Curtisha Bendix, MD       PTA  Medications: No prescriptions prior to admission.    Musculoskeletal: Strength & Muscle Tone: within normal limits Gait & Station: normal Patient leans: N/A  Psychiatric Specialty Exam: Physical Exam  Nursing note and vitals reviewed. Constitutional: He is oriented to person, place, and time.  Neurological: He is alert and oriented to person, place, and time.    Review of Systems  Unable to perform ROS: Mental acuity  Psychiatric/Behavioral: Positive for substance abuse. Negative for depression, hallucinations and suicidal ideas. The patient has insomnia. The patient is not nervous/anxious.     Blood pressure 119/74, pulse 95, temperature 98.4 F (36.9 C), resp. rate 20, height 5\' 7"  (1.702 m), weight 136 lb (61.7 kg).Body mass index is 21.3 kg/m.  General Appearance: Disheveled  Eye Contact:  Fair  Speech:  Clear and Coherent and Pressured  Volume:  Normal  Mood:  "great"  Affect:  Restricted  Thought Process:  Disorganized and Descriptions of Associations: Tangential  Orientation:  Full (Time, Place, and Person)  Thought Content:  Rumination Perceptions: denies AH/VH  Suicidal Thoughts:  No  Homicidal Thoughts:  No  Memory:  Difficult to assess due to tangential thought process  Judgement:  Impaired  Insight:  Lacking  Psychomotor Activity:  Normal  Concentration:  Concentration: Fair and Attention Span: Fair  Recall:  Difficult to assess due to tangential thought process  Fund of Knowledge:  Difficult to assess due to tangential thought process  Language:  Good  Akathisia:  Negative  Handed:  Right  AIMS (if indicated):     Assets:  Desire for Improvement Physical Health  ADL's:  Intact  Cognition:  WNL  Sleep:      Assessment Mitchell Mckee is a 36 year old male who originally presented to ED voluntarily with complains that he needs sleep in the setting of marijuana use. Per chart review from consult note at ED, "Nursing reports that patient has been pacing  all morning but very intrusive of other." UDS positive for THC only.   # Bipolar I disorder # r/o substance induced mood disorder Exam is notable for his tangential thought process, grandiosity and slightly pressured speech, although he is cooperative though the entire interview without behavioral issues. Although it is difficult to discern in the setting of marijuana use, will start Depakote for mood stabilization given severity of his symptoms. Will uptitrate risperidone for bipolar disorder. Will continue cogentin for EPS prevention. Will have  Risperidone prn for agitation. Will continue trazodone prn for insomnia.   Plan - Start Depakote ER 750 mg qhs /consider uptitration tomorrow - Increase risperidone 3 mg qhs (today he will take total of 3 mg) - Start risperidone 1 mg BID prn for agitation - Discontinue Saphris prn, gabapentin - Continue hydroxyzine 50 mg TID prn for anxiety - Continue Trazodone 100 mg qhs for insomnia - Obtain labs for metabolic panels - Admit for crisis management and stabilization. - Medication management to reduce current symptoms to base line and improve the patient's overall level of functioning. - Monitor for the adverse effect of the medications and anger outbursts - Continue 15 minutes observation for safety concerns - Encouraged to participate in milieu therapy and group therapy counseling sessions and also work with coping skills -  Develop treatment plan to decrease risk of relapse upon discharge and to reduce the need for readmission. -  Psycho-social education regarding relapse prevention and self care. - Health care follow up as needed for medical problems. - Restart home medications where appropriate.  Treatment Plan Summary: Daily contact with patient to assess and evaluate symptoms and progress in treatment  Observation Level/Precautions:  15 minute checks  Laboratory:  HbAIC lipid  Psychotherapy:  groups  Medications:  As above   Consultations:  As needed  Discharge Concerns:  Adherence to medication  Estimated LOS: 5-7 days  Other:     Physician Treatment Plan for Primary Diagnosis: Bipolar affective disorder, manic (HCC) Long Term Goal(s): Improvement in symptoms so as ready for discharge  Short Term Goals: Ability to identify changes in lifestyle to reduce recurrence of condition will improve, Ability to verbalize feelings will improve, Ability to demonstrate self-control will improve, Ability to identify and develop effective coping behaviors will improve, Compliance with prescribed medications will improve and Ability to identify triggers associated with  substance abuse/mental health issues will improve  Physician Treatment Plan for Secondary Diagnosis: Principal Problem:   Bipolar affective disorder, manic (HCC) Active Problems:   Cannabis use disorder, severe, dependence (HCC)  Long Term Goal(s): Improvement in symptoms so as ready for discharge  Short Term Goals: Ability to identify changes in lifestyle to reduce recurrence of condition will improve, Ability to demonstrate self-control will improve, Ability to identify and develop effective coping behaviors will improve, Ability to maintain clinical measurements within normal limits will improve, Compliance with prescribed medications will improve and Ability to identify triggers associated with substance abuse/mental health issues will improve  I certify that inpatient services furnished can reasonably be expected to improve the patient's condition.    Neysa Hotter, MD 7/22/201811:17 AM

## 2016-07-31 NOTE — [facility] Group Notes (Signed)
[facility] LCSW Group Therapy  07/31/2016 11:15 AM  Type of Therapy:  Group Therapy  Participation Level:  Active  Participation Quality:  Appropriate and Attentive  Affect:  Appropriate  Cognitive:  Alert and Oriented  Insight:  Improving  Engagement in Therapy:  Improving  Modes of Intervention:  Discussion  Today's group discussed current progress in preparation for discharge. Group discussion included recognizing tools and insights gained during inpatient process to prepare for discharge. Identifying key elements for success at home including professional supports, social supports, self care and medication compliance. Also discussed assessing usefulness of coping skills in order to manage mood and emotions as you progress toward discharge. Patient made reference throughout group about working on something to bring his friends together so he didn't have to work much but could spend time with his friends. Unclear if patient understood questions being asked.   Beverly Sessionsywan J Khadeejah Castner MSW, LCSW

## 2016-07-31 NOTE — Progress Notes (Signed)
D:  Patient's self inventory sheet, patient sleeps good, no sleep medication.  Good appetite, normal energy level, good concentration.  Denied depression, hopeless and anxiety.  Denied withdrawals.  Denied SI.  Denied physical problems..  Physical pain, sore muscles from push ups.  Goal is get visit from family.  Plans to talk to visitors.   [facility]. A:  Medications administered per MD orders.  Emotional support and encouragement given patient. R:  Denied SI and HI, contracts for safety.  Denied A/V hallucinations.  Safety maintained with 15 minute checks.

## 2016-07-31 NOTE — Plan of Care (Signed)
Problem: Education: Goal: Emotional status will improve Outcome: Progressing Nurse discussed depression, anxiety, coping skills with patient.    

## 2016-07-31 NOTE — Progress Notes (Signed)
Nursing Progress Note 1900-0730  D) Patient is with tangential, pressured speech and flight of ideas. Patient is intrusive at the nurses's station and does not respond when staff attempts to redirect. Patient observed pacing the unit. Patient verbally agreeable to taking medications. Patient denies SI/HI/AVH or pain. Patient contracts for safety on the unit. Per MHT, patient observed making threatening statements towards his family this evening. See MHT note.  A) Emotional support given. 1:1 interaction and active listening provided. Patient medicated as prescribed. Snacks and fluids provided. Opportunities for questions or concerns presented to patient. Patient frequently redirected by staff. Labs, vital signs and patient behavior monitored throughout shift. Patient safety maintained with q15 min safety checks. Low fall risk precautions in place.  R) Patient receptive to interaction with nurse. Patient remains safe on the unit at this time. Patient denies any adverse medication reactions at this time. Patient remains awake on the unit. Will continue to monitor.

## 2016-07-31 NOTE — Plan of Care (Signed)
Problem: Activity: Goal: Sleeping patterns will improve Outcome: Not Progressing Patient remains awake and is pacing the unit. Sleep medications administered. Will continue to monitor.  Problem: Safety: Goal: Periods of time without injury will increase Outcome: Progressing Patient is on q15 minute safety checks and low fall risk precautions. Patient contracts for safety on the unit and remains safe at this time.

## 2016-07-31 NOTE — Progress Notes (Signed)
Per patient's visitor this evening, (patient's girlfriend), patient is not at his baseline. Visitor reports, "he started having bizarre behavior on Wednesday. He's only done this once before, years ago when he thought he took [place] and it was Meth". Visitor reports, "he is hypersexual and tried to get me to have sex with him in the bathroom". Visitor states, "he keeps telling us he is leaving tomorrow. I'm worried about him coming home".

## 2016-07-31 NOTE — Progress Notes (Signed)
Patient resting in bed since 0145 per MHT. Respirations even and unlabored.

## 2016-08-01 DIAGNOSIS — F39 Unspecified mood [affective] disorder: Secondary | ICD-10-CM

## 2016-08-01 DIAGNOSIS — F419 Anxiety disorder, unspecified: Secondary | ICD-10-CM

## 2016-08-01 LAB — LIPID PANEL
CHOL/HDL RATIO: 2.9 ratio
Cholesterol: 106 mg/dL (ref 0–200)
HDL: 37 mg/dL — AB (ref >40)
LDL CALC: 53 mg/dL (ref 0–99)
Triglycerides: 80 mg/dL (ref <150)
VLDL: 16 mg/dL (ref 0–40)

## 2016-08-01 MED ORDER — TRAZODONE HCL 100 MG PO TABS
100.0000 mg | ORAL_TABLET | Freq: Every evening | ORAL | Status: DC | PRN
  Administered 2016-08-01: 100 mg via ORAL

## 2016-08-01 MED ORDER — TRAZODONE HCL 150 MG PO TABS
150.0000 mg | ORAL_TABLET | Freq: Every day | ORAL | Status: DC
  Administered 2016-08-01: 150 mg via ORAL
  Filled 2016-08-01 (×5): qty 1

## 2016-08-01 MED ORDER — TRAZODONE HCL 100 MG PO TABS
ORAL_TABLET | ORAL | Status: AC
  Filled 2016-08-01: qty 1

## 2016-08-01 NOTE — Progress Notes (Signed)
Recreation Therapy Notes  INPATIENT RECREATION THERAPY ASSESSMENT  Patient Details Name: Mitchell Mckee T Rother MRN: [id number] DOB: [birth year] Today's Date: 08/01/2016  Pt identified his reason for admission was because his family tricked him into coming here when he was stressed out because they thought he was using drugs.  Patient Stressors: Family, Work  Pt reported he recently found out his girlfriend was pregnant and his family is currently trying to sabotage a project he has been working on.  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  Coping Skills:   Isolate, Substance Abuse, Avoidance, Exercise, Talking, Music, Sports  Personal Challenges: Anger, Communication, Social Interaction  Leisure Interests (2+):  Games - Video games, Individual - "Smoke weed"  Awareness of Community Resources:  Yes  Community Resources:  [employer], [employer]  Current Use: Yes  If no, Barriers?:    Patient Strengths:  "everything, I'm a jack of all trades."  Patient Identified Areas of Improvement:  "not really"  Current Recreation Participation:  everyday  Patient Goal for Hospitalization:  "mend stuff between girlfriend and my mom"  [place]  County [place] Residence:  [place]   Current [place] (including self-harm):  No  Current HI:  No  Consent to Intern Participation: Yes  Marvell FullerRachel Meyer, Recreation Therapy Intern  Marvell FullerRachel Meyer 08/01/2016, 11:04 AM   Caroll RancherMarjette Roan Miklos, LRT/CTRS

## 2016-08-01 NOTE — Plan of Care (Signed)
Problem: Education: Goal: Will be free of psychotic symptoms Outcome: Progressing Nurse discussed depression/anxiety/coping skills with patient.   

## 2016-08-01 NOTE — Progress Notes (Addendum)
D:  Patient denied SI and HI this morning while talking to nurse.  Denied A/V hallucinations. A:  Medications administered per MD orders.  Emotional support and encouragement given patient. R:  Safety maintained with 15 minute checks. Patient stated he was a wart on his finger and red area on L side of his hair line.  Has had these problems for months.   Patient has been attending groups today.

## 2016-08-01 NOTE — Progress Notes (Signed)
Patient's girlfriend visited tonight.  Patient and girlfriend Misty StanleyLisa decided they need a family session before patient is discharged.  Patient told girlfriend that he has not been swallowing his medicine.  Nurse confronted patient who then stated he was just kidding, that he has been swallowing his medication.  This information will be passed on to night shift.

## 2016-08-01 NOTE — Progress Notes (Signed)
Patient's girlfriend, Mitchell Mckee,called,  phone [phone], is [redacted] weeks pregnant.  Patient would like a family meeting before patient is discharged.  Tacey RuizLeah said "He is not safe to come home.  He was manic last night, hypersexual, having delusions.  He has never acted this way before."  They have been together 8 years.  Patient agreed that nurse could talk to his girlfriend and her name is on telephone consent for staff.

## 2016-08-01 NOTE — Progress Notes (Signed)
Nursing Progress Note 1900-0730  D) Patient presents with tangential, rapid speech and continues with delusions of grandeur. Patient is seen interactive in the milieu. Patient denies SI/HI/AVH or pain. Patient contracts for safety on the unit. Patient med complaint and reports "I'll do whatever you say, you're the nurse". Patient awake most of the night and is resistant to staff redirecting him to bed. Patient attempts to engage with this writer in particular all night. Patient inappropriate at times and asks "can I give you a nick name?" Patient pacing the halls despite PRN medications. Patient states "I feel like I could be up for days" but continues to state "everyone just wants to slow me down, there's nothing wrong with me".  A) Emotional support given. 1:1 interaction and active listening provided. Patient medicated as prescribed. Medications and plan of care reviewed with patient. Patient verbalized understanding without further questions. Snacks and fluids provided. Opportunities for questions or concerns presented to patient. Patient encouraged to continue to work on treatment goals. Labs, vital signs and patient behavior monitored throughout shift. Patient safety maintained with q15 min safety checks. Low fall risk precautions in place and reviewed with patient; patient verbalized understanding.  R) Patient remains awake but safe on the unit. Will continue to monitor.
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
## 2016-08-01 NOTE — Progress Notes (Signed)
[facility] MD Progress Note  08/01/2016 3:54 PM TRACKER MANCE  MRN:  [id number]  Subjective: Vicent reports, "I'm good. It is a good day. My brain is running & running. That means I'm manic. What can I tell you".  Objective: Alarik is seen, chart reviewed. He is alert. Oriented to name. He is making fair eye contact. He is verbally responsive. However, he presents with pressured speech, not redirectable at this time. His speech is also tangential, circumstantial, not making any sense. He is visible on the unit, attending group sessions. At this time, it appears he is not sleeping well at night. Will increase trazodone to 150 mg at bedtime. Will order Depakote level. Staff continue to monitor & encourage patient.  Principal Problem: Bipolar affective disorder, manic (HCC)  Diagnosis:   Patient Active Problem List   Diagnosis Date Noted  . Bipolar affective disorder, manic (HCC) [F31.10] 07/30/2016  . Cannabis use disorder, severe, dependence (HCC) [F12.20] 07/29/2016   Total Time spent with patient: 25 minutes  Past Psychiatric History: Cannabis use disorder, Bipolar effective disorder, manic episodes.  Past Medical History: History reviewed. No pertinent past medical history. History reviewed. No pertinent surgical history. Family History: History reviewed. No pertinent family history.  Family Psychiatric  History: See H&P  Social History:  History  Alcohol Use No     History  Drug Use No    Social History   Social History  . Marital status: Single    Spouse name: N/A  . Number of children: N/A  . Years of education: N/A   Social History Main Topics  . Smoking status: Current Every Day Smoker  . Smokeless tobacco: Never Used  . Alcohol use No  . Drug use: No  . Sexual activity: Not Asked   Other Topics Concern  . None   Social History Narrative  . None   Additional Social History:   Sleep: Poor  Appetite:  Fair  Current Medications: Current Facility-Administered  Medications  Medication Dose Route Frequency Provider Last Rate Last Dose  . acetaminophen (TYLENOL) tablet 650 mg  650 mg Oral Q6H PRN Charm Rings, NP      . alum & mag hydroxide-simeth (MAALOX/MYLANTA) 200-200-20 MG/5ML suspension 30 mL  30 mL Oral Q4H PRN Charm Rings, NP      . benztropine (COGENTIN) tablet 1 mg  1 mg Oral QHS Hisada, Reina, MD      . divalproex (DEPAKOTE ER) 24 hr tablet 750 mg  750 mg Oral QHS Hisada, Reina, MD   750 mg at 07/31/16 1200  . hydrOXYzine (ATARAX/VISTARIL) tablet 50 mg  50 mg Oral TID Rankin, Shuvon B, NP   50 mg at 08/01/16 1209  . magnesium hydroxide (MILK OF MAGNESIA) suspension 30 mL  30 mL Oral Daily PRN Charm Rings, NP      . nicotine (NICODERM CQ - dosed in mg/24 hours) patch 21 mg  21 mg Transdermal Daily Rankin, Shuvon B, NP   21 mg at 08/01/16 0827  . risperiDONE (RISPERDAL) tablet 1 mg  1 mg Oral BID PRN Neysa Hotter, MD      . risperiDONE (RISPERDAL) tablet 3 mg  3 mg Oral QHS Hisada, Reina, MD      . traZODone (DESYREL) tablet 100 mg  100 mg Oral QHS PRN Nira Conn A, NP   100 mg at 08/01/16 0115   Lab Results:  Results for orders placed or performed during the hospital encounter of 07/30/16 (from the past  48 hour(s))  Lipid panel     Status: Abnormal   Collection Time: 08/01/16  6:11 AM  Result Value Ref Range   Cholesterol 106 0 - 200 mg/dL   Triglycerides 80 <161<150 mg/dL   HDL 37 (L) >09>40 mg/dL   Total CHOL/HDL Ratio 2.9 RATIO   VLDL 16 0 - 40 mg/dL   LDL Cholesterol 53 0 - 99 mg/dL    Comment:        Total Cholesterol/HDL:CHD Risk Coronary Heart Disease Risk Table                     Men   Women  1/2 Average Risk   3.4   3.3  Average Risk       5.0   4.4  2 X Average Risk   9.6   7.1  3 X Average Risk  23.4   11.0        Use the calculated Patient Ratio above and the CHD Risk Table to determine the patient's CHD Risk.        ATP III CLASSIFICATION (LDL):  <100     mg/dL   Optimal  [id number]  mg/dL   Near or Above                     Optimal  130-159  mg/dL   Borderline  [id number]  mg/dL   High  >478>190     mg/dL   Very High Performed at [facility], [address] [place], [place] [address]    Blood Alcohol level:  Lab Results  Component Value Date   ETH <5 07/28/2016   Metabolic Disorder Labs: No results found for: HGBA1C, MPG No results found for: PROLACTIN Lab Results  Component Value Date   CHOL 106 08/01/2016   TRIG 80 08/01/2016   HDL 37 (L) 08/01/2016   CHOLHDL 2.9 08/01/2016   VLDL 16 08/01/2016   LDLCALC 53 08/01/2016   Physical Findings: AIMS: Facial and Oral Movements Muscles of Facial Expression: None, normal Lips and Perioral Area: None, normal Jaw: None, normal Tongue: None, normal,Extremity Movements Upper (arms, wrists, hands, fingers): None, normal Lower (legs, knees, ankles, toes): None, normal, Trunk Movements Neck, shoulders, hips: None, normal, Overall Severity Severity of abnormal movements (highest score from questions above): None, normal Incapacitation due to abnormal movements: None, normal Patient's awareness of abnormal movements (rate only patient's report): No Awareness, Dental Status Current problems with teeth and/or dentures?: No Does patient usually wear dentures?: No  CIWA:  CIWA-Ar Total: 1 COWS:  COWS Total Score: 2  Musculoskeletal: Strength & Muscle Tone: within normal limits Gait & Station: normal Patient leans: N/A  Psychiatric Specialty Exam: Physical Exam  ROS  Blood pressure (!) 130/91, pulse (!) 102, temperature 98 F (36.7 C), temperature source Oral, resp. rate 18, height 5\' 7"  (1.702 m), weight 61.7 kg (136 lb).Body mass index is 21.3 kg/m.  General Appearance: Disheveled  Eye Contact:  Fair  Speech:  Clear and Coherent and Pressured  Volume:  Normal  Mood: manic  Affect:  Restricted  Thought Process:  Disorganized and Descriptions of Associations: Tangential  Orientation:  Full (Time, Place, and Person)  Thought  Content:  Rumination Perceptions: denies AH/VH  Suicidal Thoughts:  No  Homicidal Thoughts:  No  Memory:  Difficult to assess due to tangential thought process  Judgement:  Impaired  Insight:  Lacking  Psychomotor Activity:  Normal  Concentration:  Concentration: Fair and  Attention Span: Fair  Recall:  Difficult to assess due to tangential thought process  Fund of Knowledge:  Difficult to assess due to tangential thought process  Language:  Good  Akathisia:  Negative  Handed:  Right  AIMS (if indicated):     Assets:  Desire for Improvement Physical Health  ADL's:  Intact  Cognition:  WNL  Sleep: 1.75      Treatment Plan Summary: Patient is not at his baseline. Continue to present evidence of mania. We are continuing his care    Psychiatric: Bipolar affective disorder, manic episodes. Substance Induced Mood Disorder(SUD)  Medical: Will continue monitor for any symptoms & treat on a prn basis.  Psychosocial:  Cannabis use disorder. Will encourage group counseling attendance & participation.  PLAN: 1.08-01-16: No changes made on the current plan of care, continue current regimen as recommended.  Anxiety: Continue Hydroxyzine 50 mg tid.  Mood Agitation/Control.  Continue Risperdal 1 mg bid prn. Continue Risperdal 3 mg Q bedtime.  Mood stabilization: Continue Depakote 750 mg Q hs.  EPS prevention: Continue Cogentin 1 mg at bedtime.  Insomnia: Increased Trazodone to 150 mg prn Q hs.  Nicotine Withdrawal symptoms: Will continue the nicotine patch 21 mg Q 24 hours.  Will obtain Depakote level on 08-03-16.  2. Continue to monitor mood, behavior and interaction with peers  Sanjuana Kava, NP, PMHNP, FNP-BC 08/01/2016, 3:54 PM

## 2016-08-01 NOTE — Progress Notes (Signed)
Adult Psychoeducational Group Note  Date:  08/01/2016 Time:  8:42 PM  Group Topic/Focus:  Wrap-Up Group:   The focus of this group is to help patients review their daily goal of treatment and discuss progress on daily workbooks.  Participation Level:  Active  Participation Quality:  Appropriate  Affect:  Appropriate  Cognitive:  Appropriate  Insight: Appropriate  Engagement in Group:  Engaged  Modes of Intervention:  Discussion  Additional Comments: The patient expressed that he rates today a 10.The patient also said that he attended groups.  [name], Emmanual Gauthreaux Lee 08/01/2016, 8:42 PM

## 2016-08-01 NOTE — Progress Notes (Signed)
Recreation Therapy Notes  Date: 08/01/2016 Time: 10:00am Location: [address] Dayroom  Group Topic: Coping Skills  Goal Area(s) Addresses:  Pt will successfully identify at least five things that are preventing them from moving forward. Pt will successfully identify at least two coping skills they can use to move forward from the places they are currently "stuck."  Behavioral Response: Engaged  Intervention: Art  Activity: Pt was given a piece of printer paper and asked to draw a spider web. Pt was asked to identify at least 5 things holding them back and 10 coping skills to help them get past the things that are holding them back. Pt then shared their spider webs with peers.  Education: PharmacologistCoping Skills, Discharge Planning  Education Outcome: Acknowledges understanding   Clinical Observations/Feedback:  Pt actively participated in opening discussion identifying unhealthy and healthy coping skills. Approximately 10:15am patient was asked to speak with the social worker. Approximately 10:40am pt rejoined group and identified using video games and having freedom as coping skills.  Marvell Fullerachel Meyer, Recreation Therapy Intern  Caroll RancherMarjette Klohe Lovering, LRT/CTRS

## 2016-08-01 NOTE — [facility] Suicide Risk Assessment (Signed)
[facility] INPATIENT:  Family/Significant Other Suicide Prevention Education  Suicide Prevention Education:  Patient Refusal for Family/Significant Other Suicide Prevention Education: The patient Mitchell Mckee has refused to provide written consent for family/significant other to be provided Family/Significant Other Suicide Prevention Education during admission and/or prior to discharge.  Physician notified.  Patient consents to contact w father, [name], but is unable to provide phone #.  Patient does not consent to contact w individuals identified on facesheet.  CSW has asked patient to provide phone # for father.  Sallee Langenne C Cunningham 08/01/2016, 5:32 PM

## 2016-08-01 NOTE — Plan of Care (Signed)
Problem: Activity: Goal: Sleeping patterns will improve Outcome: Not Progressing Patient remains awake for most of the night. Patient up pacing the hall.  Problem: Safety: Goal: Periods of time without injury will increase Outcome: Progressing Patient is on q15 minute safety checks and low fall risk precautions. Patient contracts for safety on the unit and remains safe at this time.

## 2016-08-01 NOTE — Tx Team (Signed)
Interdisciplinary Treatment and Diagnostic Plan Update  08/01/2016 Time of Session: 8:30 AM Mitchell MageRyan T Mckee MRN: [id number]  Principal Diagnosis: Bipolar affective disorder, manic (HCC)  Secondary Diagnoses: Principal Problem:   Bipolar affective disorder, manic (HCC) Active Problems:   Cannabis use disorder, severe, dependence (HCC)   Current Medications:  Current Facility-Administered Medications  Medication Dose Route Frequency Provider Last Rate Last Dose  . acetaminophen (TYLENOL) tablet 650 mg  650 mg Oral Q6H PRN Charm RingsLord, Jamison Y, NP      . alum & mag hydroxide-simeth (MAALOX/MYLANTA) 200-200-20 MG/5ML suspension 30 mL  30 mL Oral Q4H PRN Charm RingsLord, Jamison Y, NP      . benztropine (COGENTIN) tablet 1 mg  1 mg Oral QHS Hisada, Reina, MD      . divalproex (DEPAKOTE ER) 24 hr tablet 750 mg  750 mg Oral QHS Hisada, Reina, MD   750 mg at 07/31/16 1200  . hydrOXYzine (ATARAX/VISTARIL) tablet 50 mg  50 mg Oral TID Rankin, Shuvon B, NP   50 mg at 08/01/16 0826  . magnesium hydroxide (MILK OF MAGNESIA) suspension 30 mL  30 mL Oral Daily PRN Charm RingsLord, Jamison Y, NP      . nicotine (NICODERM CQ - dosed in mg/24 hours) patch 21 mg  21 mg Transdermal Daily Rankin, Shuvon B, NP   21 mg at 08/01/16 0827  . risperiDONE (RISPERDAL) tablet 1 mg  1 mg Oral BID PRN Neysa HotterHisada, Reina, MD      . risperiDONE (RISPERDAL) tablet 3 mg  3 mg Oral QHS Hisada, Reina, MD      . traZODone (DESYREL) tablet 100 mg  100 mg Oral QHS PRN Nira ConnBerry, Jason A, NP   100 mg at 08/01/16 0115   PTA Medications: No prescriptions prior to admission.    Patient Stressors: Marital or family conflict  Patient Strengths: Manufacturing systems engineerCommunication skills Physical Health Supportive family/friends  Treatment Modalities: Medication Management, Group therapy, Case management,  1 to 1 session with clinician, Psychoeducation, Recreational therapy.   Physician Treatment Plan for Primary Diagnosis: Bipolar affective disorder, manic (HCC) Long Term  Goal(s): Improvement in symptoms so as ready for discharge Improvement in symptoms so as ready for discharge   Short Term Goals: Ability to identify changes in lifestyle to reduce recurrence of condition will improve Ability to verbalize feelings will improve Ability to demonstrate self-control will improve Ability to identify and develop effective coping behaviors will improve Compliance with prescribed medications will improve Ability to identify triggers associated with substance abuse/mental health issues will improve Ability to identify changes in lifestyle to reduce recurrence of condition will improve Ability to demonstrate self-control will improve Ability to identify and develop effective coping behaviors will improve Ability to maintain clinical measurements within normal limits will improve Compliance with prescribed medications will improve Ability to identify triggers associated with substance abuse/mental health issues will improve  Medication Management: Evaluate patient's response, side effects, and tolerance of medication regimen.  Therapeutic Interventions: 1 to 1 sessions, Unit Group sessions and Medication administration.  Evaluation of Outcomes: Progressing  Physician Treatment Plan for Secondary Diagnosis: Principal Problem:   Bipolar affective disorder, manic (HCC) Active Problems:   Cannabis use disorder, severe, dependence (HCC)  Long Term Goal(s): Improvement in symptoms so as ready for discharge Improvement in symptoms so as ready for discharge   Short Term Goals: Ability to identify changes in lifestyle to reduce recurrence of condition will improve Ability to verbalize feelings will improve Ability to demonstrate self-control will improve Ability to identify  and develop effective coping behaviors will improve Compliance with prescribed medications will improve Ability to identify triggers associated with substance abuse/mental health issues will  improve Ability to identify changes in lifestyle to reduce recurrence of condition will improve Ability to demonstrate self-control will improve Ability to identify and develop effective coping behaviors will improve Ability to maintain clinical measurements within normal limits will improve Compliance with prescribed medications will improve Ability to identify triggers associated with substance abuse/mental health issues will improve     Medication Management: Evaluate patient's response, side effects, and tolerance of medication regimen.  Therapeutic Interventions: 1 to 1 sessions, Unit Group sessions and Medication administration.  Evaluation of Outcomes: Progressing   RN Treatment Plan for Primary Diagnosis: Bipolar affective disorder, manic (HCC) Long Term Goal(s): Knowledge of disease and therapeutic regimen to maintain health will improve  Short Term Goals: Ability to verbalize frustration and anger appropriately will improve, Ability to participate in decision making will improve and Ability to identify and develop effective coping behaviors will improve  Medication Management: RN will administer medications as ordered by provider, will assess and evaluate patient's response and provide education to patient for prescribed medication. RN will report any adverse and/or side effects to prescribing provider.  Therapeutic Interventions: 1 on 1 counseling sessions, Psychoeducation, Medication administration, Evaluate responses to treatment, Monitor vital signs and CBGs as ordered, Perform/monitor CIWA, COWS, AIMS and Fall Risk screenings as ordered, Perform wound care treatments as ordered.  Evaluation of Outcomes: Progressing   LCSW Treatment Plan for Primary Diagnosis: Bipolar affective disorder, manic (HCC) Long Term Goal(s): Safe transition to appropriate next level of care at discharge, Engage patient in therapeutic group addressing interpersonal concerns.  Short Term Goals:  Engage patient in aftercare planning with referrals and resources, Facilitate acceptance of mental health diagnosis and concerns, Identify triggers associated with mental health/substance abuse issues and Increase skills for wellness and recovery  Therapeutic Interventions: Assess for all discharge needs, 1 to 1 time with Social worker, Explore available resources and support systems, Assess for adequacy in community support network, Educate family and significant other(s) on suicide prevention, Complete Psychosocial Assessment, Interpersonal group therapy.  Evaluation of Outcomes: Progressing   Progress in Treatment: Attending groups: Yes. Participating in groups: Yes. Taking medication as prescribed: Yes. Toleration medication: Yes. Family/Significant other contact made: No, will contact:  father when patient is able to supply phone number Patient understands diagnosis: No. and As evidenced by:  patient disagrees of diagnosis of bipolar, admits to anxiety only Discussing patient identified problems/goals with staff: Yes. Medical problems stabilized or resolved: Yes. Denies suicidal/homicidal ideation: Yes. and As evidenced by:  contracts for safety on unit, states admission was a "mistake" and "my family misunderstood me" Issues/concerns per patient self-inventory: No. Other: NA  New problem(s) identified: Yes, Describe:  patient states he has insurance to cover outpatient care, CSW requesting help to identify carrier/coverage; patient wants family therapy beginning w long term girlfriend  New Short Term/Long Term Goal(s): identify aftercare provider, "I just want my freedom"," I want to get back to work so I dont lose my job:  Discharge Plan or Barriers: assess stability of housing w girlfriend, arrange aftercare  Reason for Continuation of Hospitalization: Anxiety Mania Medication stabilization  Estimated Length of Stay: 5 - 7 days, 08/05/16  Attendees: Patient: 08/01/2016 11:52 AM   Physician: Alyse Low MD 08/01/2016 11:52 AM  Nursing: Meriam Sprague RN 08/01/2016 11:52 AM  RN Care Manager: Sondra Barges RN CM 08/01/2016 11:52 AM  Social Worker:  A Cunningham LCSW 08/01/2016 11:52 AM  Recreational Therapist:  08/01/2016 11:52 AM  Other:  08/01/2016 11:52 AM  Other:  08/01/2016 11:52 AM  Other: 08/01/2016 11:52 AM    Scribe for Treatment Team: Sallee Lange, LCSW 08/01/2016 11:52 AM

## 2016-08-01 NOTE — [facility] Group Notes (Signed)
[facility] LCSW Group Therapy  08/01/2016 1:30 - 2:15 PM  Todays Topic: Overcoming Obstacles. Patients identified one short term goal and potential obstacles in reaching this goal. Patients processed barriers involved in overcoming these obstacles. Patients identified steps necessary for overcoming these obstacles and explored motivation (internal and external) for facing these difficulties head on.  Type of Therapy:  Group Therapy  Participation Level:  Active  Participation Quality:  Directive of others, somewhat aggressive  Affect:  Irritable, labile  Cognitive:  Appropriate  Insight:  Limited  Engagement in Therapy:  Developing/improving  Modes of Intervention:  Discussion, Problem-solving and Support  Summary of Progress/Problems:"I dont need this group, I already know all this stuff.  Its important for all these other people to be here."  Patient went into the hall to encourage others to attend.  Aligned w significantly intrusive, irritable peer's statements, can be dismissive of concerns of others.  Limited insight into his own behaviors.   Mitchell Langenne C Cunningham  LCSW 08/01/2016, 3:16 PM

## 2016-08-01 NOTE — [facility] Group Notes (Signed)
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

## 2016-08-02 LAB — HEMOGLOBIN A1C
HEMOGLOBIN A1C: 5 % (ref 4.8–5.6)
MEAN PLASMA GLUCOSE: 97 mg/dL

## 2016-08-02 NOTE — Progress Notes (Addendum)
  DATA ACTION RESPONSE  Objective- Pt. is visible in the dayroom, seen interacting with staff and peers. Presents with an animated affect and mood. Pt. appears restless and hyperactive; seen pacing often. No further c/o. No abnormal s/s.  Subjective- Denies having any SI/HI/AVH/Pain at this time.Pt. states " I feel so happy right now". Pt. states he would like to talk to the social worker concerning getting a disability. Is cooperative and remain safe on the unit.  1:1 interaction in private to establish rapport. Encouragement, education, & support given from staff. Trazodone refused at bedtime.   Safety maintained with Q 15 checks. Continue with POC.

## 2016-08-02 NOTE — Plan of Care (Signed)
Problem: Activity: Goal: Interest or engagement in activities will improve Outcome: Progressing Pt participated in group this evening.    

## 2016-08-02 NOTE — Progress Notes (Addendum)
  [facility] :  Will you be returning to the same living situation after discharge:  Yes,  Pt returning home At discharge, do you have transportation home?: Yes,  Pt family to pick up Do you have the ability to pay for your medications: Yes,  Pt provided with prescriptions  Release of information consent forms completed and in the chart;  Patient's signature needed at discharge.  Patient to Follow up at: Follow-up Information    [facility] Follow up on 08/05/2016.   Specialty:  Behavioral Health Why:  at 10:00am for your initial assessment for services.  Contact information: [address] [place] [address] [phone]           Next level of care provider [facility] Link:no  Safety Planning and Suicide Prevention discussed: Yes,  with Pt; Pt was unable to provide contact information for father  Have you used any form of tobacco in the last 30 days? (Cigarettes, Smokeless Tobacco, Cigars, and/or Pipes): Yes  Has patient been referred to the Quitline?: Patient refused referral  Patient has been referred for addiction treatment: Yes  Verdene LennertLauren C Shayle Donahoo 08/02/2016, 9:23 AM

## 2016-08-02 NOTE — Progress Notes (Addendum)
D: Pt A & O X4. Presents animated and anxious this shift. Speech remains pressured, rapid and tangential. Pt endorse racing thoughts "my mind be all over the place, it's hard to focus some times but I know I was bad on Saturday when I got here, I'm happy because I know you guys got my back; I will take the medicines if you want me to". 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  Rates his depression o/10, hopelessness 0/10 and anxiety 8/10. States his scheduled medications is helping him "I just don't want to be sleepy during the day because I like to work, so you tell me if it's too much". Pt observed in unit groups and contributed to discussions. Reports he's eating and sleeping good with good concentration and hyperactive energy level on self inventory sheet.  A: Support and encouragement provided to pt. Medications administered as prescribed and effects monitored. Routine safety checks maintained without self harm gestures or outburst to note. R: Pt receptive to care. Compliant with medications when offered. Denies adverse drug reactions thus far this shift. Remains safe on and off unit. POC effective for safety and mood stability.

## 2016-08-02 NOTE — Progress Notes (Signed)
[facility] MD Progress Note  08/02/2016 11:43 AM Mitchell Mckee  MRN:  [id number]  Subjective: Mitchell Mckee reports, "I'm great. I'm doing really good in this place, just a little tired. The medicines I'm getting here are working & helping. I'm beginning to think clearly again. My mind runs very fast. That was why I was smoking weed to slow my thinking because I was not able to focus. I'm starting to figure things out now, thanks to you all. This hospital is great. There are rules & order. I have been to other mental health hospitals & saw how disorganized & dangerous they are. Like the [facility], there are fights going off every five minutes, patients trying to kill one another. I can stay here for weeks or months, it would not bother me. I got three meals, snacks, TV & books to read".  Objective: Mitchell Mckee is seen, chart reviewed. He is alert. Oriented to x 4. He is visible on the unit, attending group sessions today. He is making good eye contact. He is verbally responsive. However, he continues to present with pressured speech, not redirectable at this time. His speech is also tangential, circumstantial, but making more sense today. Other than the hypomanic episodes, he does not appear to be responding to any internal stimuli. His trazodone was increased to 150 mg at bedtime yesterday. Will order Depakote level to be done on 08-03-16. Staff continue to monitor & encourage patient.  Principal Problem: Bipolar affective disorder, manic (HCC)  Diagnosis:   Patient Active Problem List   Diagnosis Date Noted  . Bipolar affective disorder, manic (HCC) [F31.10] 07/30/2016  . Cannabis use disorder, severe, dependence (HCC) [F12.20] 07/29/2016   Total Time spent with patient: 15 minutes  Past Psychiatric History: Cannabis use disorder, Bipolar effective disorder, manic episodes.  Past Medical History: History reviewed. No pertinent past medical history. History reviewed. No pertinent surgical history. Family  History: History reviewed. No pertinent family history.  Family Psychiatric  History: See H&P  Social History:  History  Alcohol Use No     History  Drug Use No    Social History   Social History  . Marital status: Single    Spouse name: N/A  . Number of children: N/A  . Years of education: N/A   Social History Main Topics  . Smoking status: Current Every Day Smoker  . Smokeless tobacco: Never Used  . Alcohol use No  . Drug use: No  . Sexual activity: Not Asked   Other Topics Concern  . None   Social History Narrative  . None   Additional Social History:   Sleep: Poor  Appetite:  Fair  Current Medications: Current Facility-Administered Medications  Medication Dose Route Frequency Provider Last Rate Last Dose  . acetaminophen (TYLENOL) tablet 650 mg  650 mg Oral Q6H PRN Charm Rings, NP      . alum & mag hydroxide-simeth (MAALOX/MYLANTA) 200-200-20 MG/5ML suspension 30 mL  30 mL Oral Q4H PRN Charm Rings, NP      . benztropine (COGENTIN) tablet 1 mg  1 mg Oral QHS Neysa Hotter, MD   1 mg at 08/01/16 2146  . divalproex (DEPAKOTE ER) 24 hr tablet 750 mg  750 mg Oral QHS Hisada, Reina, MD   750 mg at 08/01/16 2146  . hydrOXYzine (ATARAX/VISTARIL) tablet 50 mg  50 mg Oral TID Rankin, Shuvon B, NP   50 mg at 08/02/16 0759  . magnesium hydroxide (MILK OF MAGNESIA) suspension 30 mL  30 mL Oral Daily PRN Charm RingsLord, Jamison Y, NP      . nicotine (NICODERM CQ - dosed in mg/24 hours) patch 21 mg  21 mg Transdermal Daily Rankin, Shuvon B, NP   21 mg at 08/02/16 0830  . risperiDONE (RISPERDAL) tablet 1 mg  1 mg Oral BID PRN Neysa HotterHisada, Reina, MD      . risperiDONE (RISPERDAL) tablet 3 mg  3 mg Oral QHS Neysa HotterHisada, Reina, MD   3 mg at 08/01/16 2146  . traZODone (DESYREL) tablet 150 mg  150 mg Oral QHS Armandina StammerNwoko, Amorah Sebring I, NP   150 mg at 08/01/16 2146   Lab Results:  Results for orders placed or performed during the hospital encounter of 07/30/16 (from the past 48 hour(s))  Lipid panel      Status: Abnormal   Collection Time: 08/01/16  6:11 AM  Result Value Ref Range   Cholesterol 106 0 - 200 mg/dL   Triglycerides 80 <811<150 mg/dL   HDL 37 (L) >91>40 mg/dL   Total CHOL/HDL Ratio 2.9 RATIO   VLDL 16 0 - 40 mg/dL   LDL Cholesterol 53 0 - 99 mg/dL    Comment:        Total Cholesterol/HDL:CHD Risk Coronary Heart Disease Risk Table                     Men   Women  1/2 Average Risk   3.4   3.3  Average Risk       5.0   4.4  2 X Average Risk   9.6   7.1  3 X Average Risk  23.4   11.0        Use the calculated Patient Ratio above and the CHD Risk Table to determine the patient's CHD Risk.        ATP III CLASSIFICATION (LDL):  <100     mg/dL   Optimal  [id number]  mg/dL   Near or Above                    Optimal  130-159  mg/dL   Borderline  [id number]  mg/dL   High  >657>190     mg/dL   Very High Performed at [facility], [address] [place], [place] [address]   Hemoglobin A1c     Status: None   Collection Time: 08/01/16  6:11 AM  Result Value Ref Range   Hgb A1c MFr Bld 5.0 4.8 - 5.6 %    Comment: (NOTE)         Pre-diabetes: 5.7 - 6.4         Diabetes: >6.4         Glycemic control for adults with diabetes: <7.0    Mean Plasma Glucose 97 mg/dL    Comment: (NOTE) Performed At: [facility] [address] [place], [place] [id number] Mila HomerHancock William F MD GM:[id number] Performed at [facility], [address] [place], [place] [address]    Blood Alcohol level:  Lab Results  Component Value Date   ETH <5 07/28/2016   Metabolic Disorder Labs: Lab Results  Component Value Date   HGBA1C 5.0 08/01/2016   MPG 97 08/01/2016   No results found for: PROLACTIN Lab Results  Component Value Date   CHOL 106 08/01/2016   TRIG 80 08/01/2016   HDL 37 (L) 08/01/2016   CHOLHDL 2.9 08/01/2016   VLDL 16 08/01/2016   LDLCALC 53 08/01/2016   Physical  Findings: AIMS: Facial and Oral Movements Muscles of Facial Expression: None, normal Lips  and Perioral Area: None, normal Jaw: None, normal Tongue: None, normal,Extremity Movements Upper (arms, wrists, hands, fingers): None, normal Lower (legs, knees, ankles, toes): None, normal, Trunk Movements Neck, shoulders, hips: None, normal, Overall Severity Severity of abnormal movements (highest score from questions above): None, normal Incapacitation due to abnormal movements: None, normal Patient's awareness of abnormal movements (rate only patient's report): No Awareness, Dental Status Current problems with teeth and/or dentures?: No Does patient usually wear dentures?: No  CIWA:  CIWA-Ar Total: 1 COWS:  COWS Total Score: 3  Musculoskeletal: Strength & Muscle Tone: within normal limits Gait & Station: normal Patient leans: N/A  Psychiatric Specialty Exam: Physical Exam  ROS  Blood pressure 116/75, pulse (!) 113, temperature 97.9 F (36.6 C), resp. rate 18, height 5\' 7"  (1.702 m), weight 61.7 kg (136 lb).Body mass index is 21.3 kg/m.  General Appearance: Disheveled  Eye Contact:  Fair  Speech:  Clear and Coherent and Pressured  Volume:  Normal  Mood: manic  Affect:  Restricted  Thought Process:  Disorganized and Descriptions of Associations: Tangential  Orientation:  Full (Time, Place, and Person)  Thought Content:  Rumination Perceptions: denies AH/VH  Suicidal Thoughts:  No  Homicidal Thoughts:  No  Memory:  Difficult to assess due to tangential thought process  Judgement:  Impaired  Insight:  Lacking  Psychomotor Activity:  Normal  Concentration:  Concentration: Fair and Attention Span: Fair  Recall:  Difficult to assess due to tangential thought process  Fund of Knowledge:  Difficult to assess due to tangential thought process  Language:  Good  Akathisia:  Negative  Handed:  Right  AIMS (if indicated):     Assets:  Desire for Improvement Physical Health  ADL's:  Intact  Cognition:  WNL  Sleep: 1.75      Treatment Plan Summary: Patient is not at his  baseline. Continue to present evidence of mania. We are continuing his care    Psychiatric: Bipolar affective disorder, manic episodes. Substance Induced Mood Disorder(SUD)  Medical: Will continue monitor for any symptoms & treat on a prn basis.  Psychosocial:  Cannabis use disorder. Will encourage group counseling attendance & participation.  PLAN: 1.08-02-16: No changes made on the current plan of care, continue current regimen as recommended.  Anxiety: Continue Hydroxyzine 50 mg tid.  Mood Agitation/Control.  Continue Risperdal 1 mg bid prn. Continue Risperdal 3 mg Q bedtime.  Mood stabilization: Continue Depakote 750 mg Q hs.  EPS prevention: Continue Cogentin 1 mg at bedtime.  Insomnia: Continue Trazodone 150 mg prn Q hs.  Nicotine Withdrawal symptoms: Will continue the nicotine patch 21 mg Q 24 hours.  Will obtain Depakote level on 08-03-16.  2. Continue to monitor mood, behavior and interaction with peers  Sanjuana Kava, NP, PMHNP, FNP-BC 08/02/2016, 11:43 AMPatient ID: Mitchell Mckee, male   DOB: 04-Apr-1980, 35 y.o.   MRN: [id number]

## 2016-08-02 NOTE — Progress Notes (Signed)
Recreation Therapy Notes  Date: 08/02/2016 Time: 10:00am Location: [address] Dayroom  Group Topic: Exercise  Goal Area(s) Addresses:  Pt will be able to identify the benefits of exercising. Pt will be able to experience the benefits of exercising during the group.  Behavioral Response: Engaged  Intervention: Exercise  Activity: Pt will throw two dice and depending on the number they roll they will do the exercise that corresponds with that number.  Education: Exercise, Discharge Planning  Education Outcome: Acknowledges understanding  Clinical Observations/Feedback: Pt participated in different exercises such as push ups and mountain climbers. Pt identified that he participates in exercising to get strong.  Marvell Fullerachel Meyer, Recreation Therapy Intern  Caroll RancherMarjette Deloise Marchant, LRT/CTRS

## 2016-08-03 LAB — VALPROIC ACID LEVEL: VALPROIC ACID LVL: 55 ug/mL (ref 50.0–100.0)

## 2016-08-03 MED ORDER — NICOTINE 21 MG/24HR TD PT24: 21.0000 mg | MEDICATED_PATCH | Freq: Every day | TRANSDERMAL | 0 refills | Status: DC

## 2016-08-03 MED ORDER — HYDROXYZINE HCL 50 MG PO TABS: 50.0000 mg | ORAL_TABLET | Freq: Three times a day (TID) | ORAL | 0 refills | Status: DC

## 2016-08-03 MED ORDER — BENZTROPINE MESYLATE 1 MG PO TABS: 1.0000 mg | ORAL_TABLET | Freq: Every day | ORAL | 0 refills | Status: DC

## 2016-08-03 MED ORDER — TRAZODONE HCL 150 MG PO TABS: 150.0000 mg | ORAL_TABLET | Freq: Every day | ORAL | 0 refills | Status: DC

## 2016-08-03 MED ORDER — DIVALPROEX SODIUM ER 250 MG PO TB24: 750.0000 mg | ORAL_TABLET | Freq: Every day | ORAL | 0 refills | Status: DC

## 2016-08-03 MED ORDER — RISPERIDONE 3 MG PO TABS: 3.0000 mg | ORAL_TABLET | Freq: Every day | ORAL | 0 refills | Status: DC

## 2016-08-03 NOTE — Progress Notes (Signed)
Recreation Therapy Notes  INPATIENT RECREATION TR PLAN  Patient Details Name: Mitchell Mckee MRN: [id number] DOB: 1980/08/27 Today's Date: 08/03/2016  Rec Therapy Plan Is patient appropriate for Therapeutic Recreation?: Yes Treatment times per week: at least 3x Estimated Length of Stay: 5-7 days TR Treatment/Interventions: Group participation (Comment) (appropriate participation in recreation therapy group sessions)  Discharge Criteria Pt will be discharged from therapy if:: Discharged Treatment plan/goals/alternatives discussed and agreed upon by:: Patient/family  Discharge Summary Short term goals set: Patient will improve communication skills, as demonstrated by ability to actively participate in at least 2 processing discussion during recreation therapy group sessions by conclusion of recreation therapy tx  Short term goals met: Complete Progress toward goals comments: Groups attended Which groups?: Other (Comment), Self-esteem, Coping skills (exercise) Reason goals not met: n/a Therapeutic equipment acquired: n/a Reason patient discharged from therapy: Discharge from hospital Pt/family agrees with progress & goals achieved: Yes Date patient discharged from therapy: 08/03/16  Donovan Kail, Recreation Therapy Intern  Donovan Kail 08/03/2016, 12:32 PM   Victorino Sparrow, LRT/CTRS

## 2016-08-03 NOTE — [facility] Group Notes (Signed)
[facility] Group Notes:  (Nursing/MHT/Case Management/Adjunct)  Date:  08/03/2016  Time:  4:54 PM  Type of Therapy:  Nurse Education  Participation Level:  Active  Participation Quality:  Monopolizing  Affect:  Blunted and Labile  Cognitive:  Alert  Insight:  Limited  Engagement in Group:  Monopolizing and Off Topic  Modes of Intervention:  Discussion, Education and Limit-setting  Summary of Progress/Problems: Pt was able to identify a positive support person. Pt did require redirection for side conversations and going off topic.  Harvel QualeMardis, Rayanne Padmanabhan 08/03/2016, 4:54 PM

## 2016-08-03 NOTE — Progress Notes (Signed)
Recreation Therapy Notes  Date: 08/03/2016         Time: 10:00am Location: [address] Dayroom  Group Topic: Self-Esteem  Goal Area(s) Addresses:  Pt will be able to successfully create a piece of artwork that shows others who they are. Pt will be able to successfully share their license plate with peers.  Behavioral Response: Engaged, Redirectable, Hyperverbal  Intervention: Art  Activity: Pt will be given a blank license plate template worksheet. Pt will be asked to create a license plate that represents who they are. Pt will then share their license plate with peers.  Education:Self-Esteem, Discharge Planning  Education Outcome: Acknowledges understanding  Clinical Observations/Feedback: Pt actively participated in activity. Pt wrote down his gamer name because it defines who he is. Pt also identified that he believes in both God and [place]. Pt identified that it can be difficult to prove to other people what your unique traits are when they assume who you are. Pt needed redirection many times during group.   Marvell Fullerachel Meyer, Recreation Therapy Intern   Caroll RancherMarjette Dariel Betzer, LRT/CTRS

## 2016-08-03 NOTE — Progress Notes (Signed)
Pt. slept a total of 15 mins last night. Pt. continually pace the hallway. No disruptive behaviors noted. Pt. refused Trazodone 150 last night stating "It makes me feel weird". Pt. continues to remain hyperactive and restless.

## 2016-08-03 NOTE — Progress Notes (Signed)
Patient ID: Lars MageRyan T Mckee, male   DOB: 08-25-1980, 36 y.o.   MRN: [id number] Pt d/c to home with girlfriend after family session. D/c instructions, rx's, transitional AVS given and reviewed. Items from locker returned. Pt verbalizes understanding of aftercare and medication regime. Pt denies s.i.

## 2016-08-03 NOTE — Plan of Care (Signed)
Problem: [facility] Participation in Recreation Therapeutic Interventions Goal: STG-Patient will demonstrate improved communication skills b STG: Communication - Patient will improve communication skills, as demonstrated by ability to actively participate in at least 2 processing discussion during recreation therapy group sessions by conclusion of recreation therapy tx  Outcome: Completed/Met Date Met: 08/03/16 Pt successfully showed improvement in his communication skills by actively participating in recreation therapy group discussions.  Donovan Kail, Recreation Therapy Intern

## 2016-08-03 NOTE — [facility] Group Notes (Signed)
[facility] LCSW Group Therapy  08/03/2016 2:23 PM  Type of Therapy:  Group Therapy  Participation Level:  Active  Participation Quality:  Appropriate and Sharing  Affect:  Appropriate  Cognitive:  Appropriate  Insight:  Developing/Improving  Engagement in Therapy:  Engaged  Modes of Intervention:  Activity, Discussion, Socialization and Support  Summary of Progress/Problems: CSW started off group with an ice breaker in order to get each participant active and alert. Each participant introduced self and thought about one word that describes them with the first letter of their name. Group participants were then asked to give their own definition of what emotion regulation means to them. Talk about what emotion they find most difficult to manage and the physical symptoms they encounter. Group members were then asked to think about positive coping skills to regulate their emotions. Xavious participated in group, however had to be redirected for inappropriate comments. Patient mentioned that his anger is most difficult for him to handle and he tries to walk away before he's taken out of character. Patient reports arguments with girlfriend that typically escalate. CSW provided feedback and patient was receptive. No concerns to report at this time.   Georgiann MohsJoyce S Marysa Wessner 08/03/2016, 2:23 PM

## 2016-08-03 NOTE — Discharge Summary (Signed)
Physician Discharge Summary Note  Patient:  Mitchell Mckee is an 36 y.o., male  MRN:  [id number]  DOB:  04/03/1980  Patient phone:  [phone] (home)   Patient address:   [address] [place] [address],   Total Time spent with patient: Greater than 30 minutes  Date of Admission:  07/30/2016 Date of Discharge: 08-03-16  Reason for Admission: Worsening symptoms of Bipolar disorder.  Principal Problem: Bipolar affective disorder, manic [facility](HCC)  Discharge Diagnoses: Patient Active Problem List   Diagnosis Date Noted  . Bipolar affective disorder, manic (HCC) [F31.10] 07/30/2016  . Cannabis use disorder, severe, dependence (HCC) [F12.20] 07/29/2016   Past Psychiatric History: See H&P  Past Medical History: History reviewed. No pertinent past medical history. History reviewed. No pertinent surgical history.  Family History: History reviewed. No pertinent family history.  Family Psychiatric  History: See H&P  Social History:  History  Alcohol Use No     History  Drug Use No    Social History   Social History  . Marital status: Single    Spouse name: N/A  . Number of children: N/A  . Years of education: N/A   Social History Main Topics  . Smoking status: Current Every Day Smoker  . Smokeless tobacco: Never Used  . Alcohol use No  . Drug use: No  . Sexual activity: Not Asked   Other Topics Concern  . None   Social History Narrative  . None   Hospital Course: Mitchell Mckee is a 36 year old male who presented to ED voluntarily with complains that he needs sleep. Per chart review from consult note at ED, "Nursing reports that patient has been pacing all morning but very intrusive of others. He is a very poor historian due to tangential thought process, although he is cooperative. Patient states that he needs help for his mother and his girlfriend at home. He wants to "prove it to my family." He then talks about dropping a project and started another project for "them  (other people)" who you can earned money by playing video games. He then states that "they (family) have bipolar disorder," although they told him that he is "crazy." He states that he may have seen psychiatrist, although he does not know anybody he can trust. He is unable to elaborate the medication he tried in the past, although he is willing to try anything in this hospital.  Mitchell Mc[facility] for crisis management due to worsening symptoms of Bipolar disorder. He presented to the ED with tangential speech & disorganized behavior. [facility] which [facility]kee later admitted using because it helps slow his racing mind. He was in need of mood stabilization treatment.   After evaluation of his presenting symptoms, Mitchell Mckee was medicated & discharged on; Cogentin 1 mg for EPS, Depakote 750 mg for mood stabilization, Hydroxyzine 25 mg prn for anxiety, Nicotine patch 21 mg for smoking cessation, Risperdal 3 mg for mood control & Trazodone 150 mg for insomnia. He presented no other pre-existing medical problems that required treatment. He was enrolled & participated in the group counseling sessions being offered & held on this unit. He learned coping skills.  During the course of his hosptalization, Mitchell Mckee improvement was monitored by observation & his daily report of symptom reduction noted.  His emotional & mental status were monitored by daily self-inventory reports completed by him & the clinical staff. He reported continued improvement on daily basis & denied  any new concerns. He was encouraged to attend groups to help with recognizing triggers of emotional crises & ways to cope better with them.         As his treatment continues, Mitchell Mckee was evaluated by the treatment team on daily basis for mood stability & the need for continued recovery after discharge. He was offered further treatment options upon discharge on an outpatient basis as noted below. He was encouraged to maintain  satisfactory support network & home environment to help with his recovery & stability. He was instructed & encouraged to adhere to his medication regimen as recommended by his treatment team.    Upon discharge, Mitchell Mckee was both mentally & medically stable denying suicidal/homicidal ideation, auditory/visual/tactile hallucinations, delusional thoughts and paranoia. He was provided with all the necessary information needed to make his outpatient appointment without problems. [facility] with all personal belongings in no distress.  Transportation per family.   Physical Findings: AIMS: Facial and Oral Movements Muscles of Facial Expression: None, normal Lips and Perioral Area: None, normal Jaw: None, normal Tongue: None, normal,Extremity Movements Upper (arms, wrists, hands, fingers): None, normal Lower (legs, knees, ankles, toes): None, normal, Trunk Movements Neck, shoulders, hips: None, normal, Overall Severity Severity of abnormal movements (highest score from questions above): None, normal Incapacitation due to abnormal movements: None, normal Patient's awareness of abnormal movements (rate only patient's report): No Awareness, Dental Status Current problems with teeth and/or dentures?: No Does patient usually wear dentures?: No  CIWA:  CIWA-Ar Total: 1 COWS:  COWS Total Score: 3  Musculoskeletal: Strength & Muscle Tone: within normal limits Gait & Station: normal Patient leans: N/A  Psychiatric Specialty Exam: Physical Exam  Constitutional: He is oriented to person, place, and time. He appears well-developed.  HENT:  Head: Normocephalic.  Eyes: Pupils are equal, round, and reactive to light.  Neck: Normal range of motion.  Cardiovascular: Normal rate.   Respiratory: Effort normal.  GI: Soft.  Genitourinary:  Genitourinary Comments: Deferred  Musculoskeletal: Normal range of motion.  Neurological: He is alert and oriented to person, place, and time.  Skin: Skin is warm and dry.     Review of Systems  Constitutional: Negative.   HENT: Negative.   Eyes: Negative.   Respiratory: Negative.   Cardiovascular: Negative.   Gastrointestinal: Negative.   Genitourinary: Negative.   Musculoskeletal: Negative.   Skin: Negative.   Neurological: Negative.   Endo/Heme/Allergies: Negative.   Psychiatric/Behavioral: Positive for depression (Stable) and substance abuse (Hx. Cannabis use disorder). Negative for hallucinations, memory loss and suicidal ideas. The patient has insomnia (Stable). The patient is not nervous/anxious.     Blood pressure 137/83, pulse (!) 122, temperature 98.4 F (36.9 C), temperature source Oral, resp. rate 20, height 5\' 7"  (1.702 m), weight 61.7 kg (136 lb).Body mass index is 21.3 kg/m.  See Md's SRA   Have you used any form of tobacco in the last 30 days? (Cigarettes, Smokeless Tobacco, Cigars, and/or Pipes): Yes  Has this patient used any form of tobacco in the last 30 days? (Cigarettes, Smokeless Tobacco, Cigars, and/or Pipes): Yes, provided with a nicotine patch 21 mg prescription upon discharge.  Blood Alcohol level:  Lab Results  Component Value Date   ETH <5 07/28/2016   Metabolic Disorder Labs:  Lab Results  Component Value Date   HGBA1C 5.0 08/01/2016   MPG 97 08/01/2016   No results found for: PROLACTIN Lab Results  Component Value Date   CHOL 106 08/01/2016   TRIG 80 08/01/2016  HDL 37 (L) 08/01/2016   CHOLHDL 2.9 08/01/2016   VLDL 16 08/01/2016   LDLCALC 53 08/01/2016   See Psychiatric Specialty Exam and Suicide Risk Assessment completed by Attending Physician prior to discharge.  Discharge destination:  Home  Is patient on multiple antipsychotic therapies at discharge:  No   Has Patient had three or more failed trials of antipsychotic monotherapy by history:  No  Recommended Plan for Multiple Antipsychotic Therapies: NA  Allergies as of 08/03/2016   No Known Allergies     Medication List    TAKE these  medications     Indication  benztropine 1 MG tablet Commonly known as:  COGENTIN Take 1 tablet (1 mg total) by mouth at bedtime. For prevention of tremors  Indication:  Extrapyramidal Reaction caused by Medications   divalproex 250 MG 24 hr tablet Commonly known as:  DEPAKOTE ER Take 3 tablets (750 mg total) by mouth at bedtime. For mood stabilization  Indication:  Mood stabilization   hydrOXYzine 50 MG tablet Commonly known as:  ATARAX/VISTARIL Take 1 tablet (50 mg total) by mouth 3 (three) times daily. For anxiety  Indication:  Feeling Anxious   nicotine 21 mg/24hr patch Commonly known as:  NICODERM CQ - dosed in mg/24 hours Place 1 patch (21 mg total) onto the skin daily. For smoking cessation  Indication:  Nicotine Addiction   risperiDONE 3 MG tablet Commonly known as:  RISPERDAL Take 1 tablet (3 mg total) by mouth at bedtime. For mood control  Indication:  Mood control   traZODone 150 MG tablet Commonly known as:  DESYREL Take 1 tablet (150 mg total) by mouth at bedtime. For sleep  Indication:  Trouble Sleeping      Follow-up Information    [facility] Follow up on 08/05/2016.   Specialty:  Behavioral Health Why:  at 10:00am for your initial assessment for services.  Contact information: [address] [place] [address] [phone]          Follow-up recommendations: Activity:  As tolerated Diet: As recommended by your primary care doctor. Keep all scheduled follow-up appointments as recommended.   Comments: Patient is instructed prior to discharge to: Take all medications as prescribed by his/her mental healthcare provider. Report any adverse effects and or reactions from the medicines to his/her outpatient provider promptly. Patient has been instructed & cautioned: To not engage in alcohol and or illegal drug use while on prescription medicines. In the event of worsening symptoms, patient is instructed to call the crisis hotline, 911 and  or go to the nearest ED for appropriate evaluation and treatment of symptoms. To follow-up with his/her primary care provider for your other medical issues, concerns and or health care needs.   Signed: Sanjuana Kava, NP, PMHNP, FNP-BC 08/03/2016, 9:56 AM

## 2016-08-03 NOTE — [facility] Suicide Risk Assessment (Signed)
[facility] Discharge Suicide Risk Assessment   Principal Problem: Bipolar affective disorder, manic [facility](HCC) Discharge Diagnoses:  Patient Active Problem List   Diagnosis Date Noted  . Bipolar affective disorder, manic (HCC) [F31.10] 07/30/2016  . Cannabis use disorder, severe, dependence [facility](HCC) [F12.20] 07/29/2016  Patient is a 36 year old male [facility] for [facility] and treatment psychosis, patient was noted to have flight of ideas, had not slept in several days.  Patient reports that he is doing much better, adds that he can get his thoughts together, reports that he plans to take his medications as prescribed, adds that he still at times has multiple thoughts in his head but that he can keep up with the thoughts, his mood is good, he reports that he is sleeping well and eating fine. Patient states that he plans to follow-up outpatient, feels that medications have helped him greatly and denies any thoughts of self-harm, harm to others. He reports that his concentration has also improved, he knows he needs to continue treatment. He denies any symptoms of psychosis  Total Time spent with patient: 30 minutes  Musculoskeletal: Strength & Muscle Tone: within normal limits Gait & Station: normal Patient leans: N/A  Psychiatric Specialty Exam: Review of Systems  Constitutional: Negative.  Negative for fever and malaise/fatigue.  HENT: Negative.  Negative for congestion and sore throat.   Eyes: Negative.  Negative for blurred vision, double vision, discharge and redness.  Respiratory: Negative.  Negative for cough, shortness of breath and wheezing.   Cardiovascular: Negative.  Negative for chest pain and palpitations.  Gastrointestinal: Negative.  Negative for abdominal pain, constipation, diarrhea, heartburn, nausea and vomiting.  Genitourinary: Negative.  Negative for dysuria.  Musculoskeletal: Negative.  Negative for falls and myalgias.  Skin: Negative.  Negative for rash.   Neurological: Negative for dizziness, seizures and loss of consciousness.  Endo/Heme/Allergies: Negative.  Negative for environmental allergies.  Psychiatric/Behavioral: Negative for depression, hallucinations, memory loss, substance abuse and suicidal ideas. The patient is not nervous/anxious and does not have insomnia.     Blood pressure 137/83, pulse (!) 122, temperature 98.4 F (36.9 C), temperature source Oral, resp. rate 20, height 5\' 7"  (1.702 m), weight 61.7 kg (136 lb).Body mass index is 21.3 kg/m.  General Appearance: Casual  Eye Contact::  Fair  Speech:  Clear and Coherent and Mildly pressured409  Volume:  Normal  Mood:  Euthymic  Affect:  Congruent and Full Range  Thought Process:  Coherent, Goal Directed and Descriptions of Associations: Intact  Orientation:  Full (Time, Place, and Person)  Thought Content:  WDL  Suicidal Thoughts:  No  Homicidal Thoughts:  No  Memory:  Immediate;   Fair Recent;   Fair Remote;   Fair  Judgement:  Intact  Insight:  Present  Psychomotor Activity:  Normal  Concentration:  Fair  Recall:  Fis[employer]Fair  Fund of Knowledge:Good  Language: Fair  Akathisia:  No  Handed:  Right  AIMS (if indicated):     Assets:  Communication Skills Desire for Improvement Housing Physical Health Social Support  Sleep:  Number of Hours: 0.25  Cognition: WNL  ADL's:  Intact   Mental Status Per Nursing Assessment::   On Admission:  NA  Demographic Factors:  Male and Caucasian  Loss Factors: Decrease in vocational status  Historical Factors: Impulsivity  Risk Reduction Factors:   Sense of responsibility to family and Living with another person, especially a relative  Continued Clinical Symptoms:  Alcohol/Substance Abuse/Dependencies More than one psychiatric diagnosis Previous  Psychiatric Diagnoses and Treatments  Cognitive Features That Contribute To Risk:  None    Suicide Risk:  Minimal: No identifiable suicidal ideation.  Patients presenting  with no risk factors but with morbid ruminations; may be classified as minimal risk based on the severity of the depressive symptoms  Follow-up Information    [facility] Follow up on 08/05/2016.   Specialty:  Behavioral Health Why:  at 10:00am for your initial assessment for services.  Contact information: [address] [place] [address] [phone]           Plan Of Care/Follow-up recommendations:  Activity:  As tolerated Diet:  Regular Other:  Keep follow-up appointments and take medications as prescribed  Nelly RoutKUMAR,Maan Zarcone, MD 08/03/2016, 4:20 PM

## 2016-08-05 ENCOUNTER — Inpatient Hospital Stay (HOSPITAL_COMMUNITY)
Admission: RE | Admit: 2016-08-05 | Discharge: 2016-08-11 | DRG: 885 | Disposition: A | Discharge status: Home / Self Care | Payer: 59 | Attending: Psychiatry | Admitting: Psychiatry

## 2016-08-05 ENCOUNTER — Encounter (HOSPITAL_COMMUNITY): Payer: Self-pay

## 2016-08-05 DIAGNOSIS — F39 Unspecified mood [affective] disorder: Secondary | ICD-10-CM | POA: Diagnosis not present

## 2016-08-05 DIAGNOSIS — F17210 Nicotine dependence, cigarettes, uncomplicated: Secondary | ICD-10-CM | POA: Diagnosis not present

## 2016-08-05 DIAGNOSIS — F17203 Nicotine dependence unspecified, with withdrawal: Secondary | ICD-10-CM | POA: Diagnosis present

## 2016-08-05 DIAGNOSIS — F419 Anxiety disorder, unspecified: Secondary | ICD-10-CM | POA: Diagnosis present

## 2016-08-05 DIAGNOSIS — F1220 Cannabis dependence, uncomplicated: Secondary | ICD-10-CM | POA: Diagnosis present

## 2016-08-05 DIAGNOSIS — Z79899 Other long term (current) drug therapy: Secondary | ICD-10-CM

## 2016-08-05 DIAGNOSIS — F3174 Bipolar disorder, in full remission, most recent episode manic: Principal | ICD-10-CM | POA: Diagnosis present

## 2016-08-05 DIAGNOSIS — G4700 Insomnia, unspecified: Secondary | ICD-10-CM | POA: Diagnosis present

## 2016-08-05 DIAGNOSIS — F3110 Bipolar disorder, current episode manic without psychotic features, unspecified: Secondary | ICD-10-CM | POA: Diagnosis not present

## 2016-08-05 MED ORDER — HYDROXYZINE HCL 25 MG PO TABS
25.0000 mg | ORAL_TABLET | Freq: Three times a day (TID) | ORAL | Status: DC | PRN
  Administered 2016-08-05 – 2016-08-11 (×5): 25 mg via ORAL
  Filled 2016-08-05 (×5): qty 1

## 2016-08-05 MED ORDER — DIVALPROEX SODIUM ER 250 MG PO TB24
750.0000 mg | ORAL_TABLET | Freq: Every day | ORAL | Status: DC
  Administered 2016-08-05 – 2016-08-06 (×2): 750 mg via ORAL
  Filled 2016-08-05 (×3): qty 3

## 2016-08-05 MED ORDER — ALUM & MAG HYDROXIDE-SIMETH 200-200-20 MG/5ML PO SUSP: 30.0000 mL | ORAL | Status: DC | PRN

## 2016-08-05 MED ORDER — TRAZODONE HCL 150 MG PO TABS
150.0000 mg | ORAL_TABLET | Freq: Every day | ORAL | Status: DC
  Filled 2016-08-05 (×8): qty 1

## 2016-08-05 MED ORDER — BENZTROPINE MESYLATE 1 MG PO TABS
1.0000 mg | ORAL_TABLET | Freq: Every day | ORAL | Status: DC
  Administered 2016-08-05 – 2016-08-10 (×6): 1 mg via ORAL
  Filled 2016-08-05 (×8): qty 1

## 2016-08-05 MED ORDER — RISPERIDONE 3 MG PO TABS
3.0000 mg | ORAL_TABLET | Freq: Every day | ORAL | Status: DC
  Administered 2016-08-05 – 2016-08-10 (×6): 3 mg via ORAL
  Filled 2016-08-05 (×9): qty 1

## 2016-08-05 MED ORDER — TRAZODONE HCL 50 MG PO TABS: 50.0000 mg | ORAL_TABLET | Freq: Every evening | ORAL | Status: DC | PRN

## 2016-08-05 MED ORDER — NICOTINE 21 MG/24HR TD PT24
21.0000 mg | MEDICATED_PATCH | Freq: Every day | TRANSDERMAL | Status: DC
  Administered 2016-08-05 – 2016-08-11 (×7): 21 mg via TRANSDERMAL
  Filled 2016-08-05 (×9): qty 1

## 2016-08-05 MED ORDER — MAGNESIUM HYDROXIDE 400 MG/5ML PO SUSP: 30.0000 mL | Freq: Every day | ORAL | Status: DC | PRN

## 2016-08-05 MED ORDER — ACETAMINOPHEN 325 MG PO TABS: 650.0000 mg | ORAL_TABLET | Freq: Four times a day (QID) | ORAL | Status: DC | PRN

## 2016-08-05 NOTE — Tx Team (Signed)
Initial Treatment Plan 08/05/2016 4:25 PM Mitchell Mckee ZOX:[id number]    PATIENT STRESSORS: Marital or family conflict Medication change or noncompliance   PATIENT STRENGTHS: Wellsite geologistCommunication skills General fund of knowledge Physical Health   PATIENT IDENTIFIED PROBLEMS: Delusional  Mania  "I just want to go home and be around my family"  "I don't want my family to "over" take care of me"               DISCHARGE CRITERIA:  Improved stabilization in mood, thinking, and/or behavior Verbal commitment to aftercare and medication compliance  PRELIMINARY DISCHARGE PLAN: Outpatient therapy Medication management  PATIENT/FAMILY INVOLVEMENT: This treatment plan has been presented to and reviewed with the patient, Mitchell Mckee.  The patient and family have been given the opportunity to ask questions and make suggestions.  Levin BaconHeather V Taryll Reichenberger, RN 08/05/2016, 4:25 PM

## 2016-08-05 NOTE — Social Work (Signed)
Per [facility] admissions, patient remains on wait list.  Anne Cunningham, LCSW Lead Clinical Social Worker Phone:  [phone]  

## 2016-08-05 NOTE — BH Assessment (Addendum)
Assessment Note  Mitchell Mckee is a 36 y.o. male, [facility], under the request of his partner, Tacey RuizLeah and Edrick KinsLeah's mother, Delray AltMichaeline, due to continued mania and delusional thought.  Pt [facility]. Pt denies SI, HI, AVH. Pt is clearly manic AEB his rapid, pressured speech and euphoric mood. Tacey RuizLeah reports that there has been no change in pt from when he was admitted to when he left. She shares that she was told that pt requested to be d/c. She reports that pt is operating in "mild psychosis and high mania". Pt repeatedly says, "I'm only 10% manic". Pt also says, "I'm on my way down from a manic state". Tacey RuizLeah also reports that pt continues to be obsessed with this one particular video game and he told her yesterday that God is texting and talking to him. Pt admitted this belief to clinician. [facility], pt was set up w/ an appt to [facility] for this morning. Tacey RuizLeah reports that they were told at the [facility] that their psychiatrist was leaving and b/c of pt's substance abuse issues, they couldn't help with his mental health issues.   Diagnosis: Bipolar I d/o  Past Medical History: No past medical history on file.  No past surgical history on file.  Family History: No family history on file.  Social History:  reports that he has been smoking.  He has never used smokeless tobacco. He reports that he does not drink alcohol or use drugs.  Additional Social History:  Alcohol / Drug Use Pain Medications: see PTA meds Prescriptions: see PTA meds Over the Counter: see PTA meds History of alcohol / drug use?: Yes Substance #1 Name of Substance 1: Marijuana 1 - Frequency: daily 1 - Duration: ongoing 1 - Last Use / Amount: last night/a joint  CIWA: CIWA-Ar BP: (!) 107/53 Pulse Rate: 94 COWS:    Allergies: No Known Allergies  Home Medications:  No prescriptions prior to admission.    OB/GYN Status:  No LMP for male patient.  General  Assessment Data [facility] TTS Assessment: In system Is this a Tele or Face-to-Face Assessment?: Face-to-Face Is this an Initial Assessment or a Re-assessment for this encounter?: Initial Assessment Marital status: Long term relationship Living Arrangements: Spouse/significant other Can pt return to current living arrangement?: Yes Admission Status: Voluntary Is patient capable of signing voluntary admission?: Yes Referral Source: Self/Family/Friend Insurance type: [facility]  Medical Screening Exam [facility] Walk-in ONLY) Medical Exam completed: Yes  Crisis Care Plan Living Arrangements: Spouse/significant other Name of Psychiatrist: none Name of Therapist: none  Education Status Is patient currently in school?: No  Risk to self with the past 6 months Suicidal Ideation: No Has patient been a risk to self within the past 6 months prior to admission? : No Suicidal Intent: No Has patient had any suicidal intent within the past 6 months prior to admission? : No Is patient at risk for suicide?: No Suicidal Plan?: No Has patient had any suicidal plan within the past 6 months prior to admission? : No Access to Means: No What has been your use of drugs/alcohol within the last 12 months?: see above Previous Attempts/Gestures: No Intentional Self Injurious Behavior: None Family Suicide History: Unknown Persecutory voices/beliefs?: No Depression: No Substance abuse history and/or treatment for substance abuse?: Yes Suicide prevention information given to non-admitted patients: Not applicable  Risk to Others within the past 6 months  Homicidal Ideation: No Does patient have any lifetime risk of violence toward others beyond the six months prior to admission? : No Thoughts of Harm to Others: No Current Homicidal Intent: No Current Homicidal Plan: No Access to Homicidal Means: No History of harm to others?: No Assessment of Violence: None Noted Does  patient have access to weapons?: No Criminal Charges Pending?: No Does patient have a court date: No Is patient on probation?: No  Psychosis Hallucinations: None noted Delusions: Grandiose, Unspecified  Mental Status Report Appearance/Hygiene: Unremarkable Eye Contact: Good Motor Activity: Hyperactivity, Freedom of movement Speech: Rapid, Pressured Level of Consciousness: Alert Mood: Euphoric, Pleasant Affect: Euphoric Anxiety Level: Minimal Thought Processes: Coherent, Relevant Judgement: Partial Orientation: Person, Place, Time, Situation Obsessive Compulsive Thoughts/Behaviors: None  Cognitive Functioning Concentration: Decreased Memory: Recent Intact, Remote Intact IQ: Average Insight: Fair Impulse Control: Fair Appetite: Good Sleep: No Change Vegetative Symptoms: None  ADLScreening [facility] Assessment Services) Patient's cognitive ability adequate to safely complete daily activities?: Yes Patient able to express need for assistance with ADLs?: Yes Independently performs ADLs?: Yes (appropriate for developmental age)  Prior Inpatient Therapy Prior Inpatient Therapy: Yes Prior Therapy Dates: 07/2016 [facility] Reason for Treatment: bipolar  Prior Outpatient Therapy Prior Outpatient Therapy: No Does patient have an ACCT team?: No Does patient have Intensive In-House Services?  : No Does patient have [facility] services? : No Does patient have P4CC services?: No  ADL Screening (condition at time of admission) Patient's cognitive ability adequate to safely complete daily activities?: Yes Is the patient deaf or have difficulty hearing?: No Does the patient have difficulty seeing, even when wearing glasses/contacts?: No Does the patient have difficulty concentrating, remembering, or making decisions?: Yes Patient able to express need for assistance with ADLs?: Yes Does the patient have difficulty dressing or bathing?: No Independently  performs ADLs?: Yes (appropriate for developmental age) Does the patient have difficulty walking or climbing stairs?: No Weakness of Legs: None Weakness of Arms/Hands: None  Home Assistive Devices/Equipment Home Assistive Devices/Equipment: None    Abuse/Neglect Assessment (Assessment to be complete while patient is alone) Physical Abuse: Denies Verbal Abuse: Denies Sexual Abuse: Denies Exploitation of patient/patient's resources: Denies Self-Neglect: Denies Values / Beliefs Cultural Requests During Hospitalization: None Spiritual Requests During Hospitalization: None   Advance Directives (For Healthcare) Does Patient Have a Medical Advance Directive?: No Would patient like information on creating a medical advance directive?: No - Patient declined    Additional Information 1:1 In Past 12 Months?: No CIRT Risk: No Elopement Risk: No Does patient have medical clearance?: Yes     Disposition:  Disposition Initial Assessment Completed for this Encounter: Yes (consulted w/ Leighton Ruffina Okonkwo, NP, who consulted w/ Dr. Lucianne MussKumar) Disposition of Patient: Inpatient treatment program Type of inpatient treatment program: Adult ([facility]  On Site Evaluation by:   Reviewed with Physician:    Laddie AquasSamantha M Jashley Yellin 08/05/2016 1:46 PM

## 2016-08-05 NOTE — H&P (Signed)
Behavioral Health Medical Screening Exam  Mitchell Mckee is an 36 y.o. male who [facility] by GF and GF's mother with complaints of hyper manic behavior. Patient actively exhibiting racing thoughts with psychomotor agitation. Patient denies SI/HI, endorsing hearing from God. Patient [facility].   Total Time spent with patient: 30 minutes  Psychiatric Specialty Exam: Physical Exam  Constitutional: He appears well-developed and well-nourished.  HENT:  Head: Normocephalic and atraumatic.  Eyes: Pupils are equal, round, and reactive to light.  Neck: Normal range of motion.    ROS  Blood pressure (!) 107/53, pulse 94, temperature 98.9 F (37.2 C), temperature source Oral, resp. rate 18, SpO2 98 %.There is no height or weight on file to calculate BMI.  General Appearance: Casual  Eye Contact:  Good  Speech:  Clear and Coherent and Pressured  Volume:  Normal  Mood:  Anxious  Affect:  Appropriate  Thought Process:  Disorganized  Orientation:  Full (Time, Place, and Person)  Thought Content:  Abstract Reasoning  Suicidal Thoughts:  No  Homicidal Thoughts:  No  Memory:  Immediate;   Good Recent;   Good Remote;   Fair  Judgement:  Fair  Insight:  Fair  Psychomotor Activity:  Increased  Concentration: Concentration: Good and Attention Span: Good  Recall:  Good  Fund of Knowledge:Good  Language: Good  Akathisia:  Negative  Handed:  Right  AIMS (if indicated):     Assets:  Communication Skills Desire for Improvement Financial Resources/Insurance Housing Intimacy Physical Health Social Support  Sleep:       Musculoskeletal: Strength & Muscle Tone: within normal limits Gait & Station: normal Patient leans: N/A  Blood pressure (!) 107/53, pulse 94, temperature 98.9 F (37.2 C), temperature source Oral, resp. rate 18, SpO2 98 %.  Recommendations:  Based on my evaluation the patient appears to have an emergency medical  condition for which I recommend the patient be transferred to the emergency department for further evaluation.  Delila PereyraJustina A Anyelin Mogle, NP 08/05/2016, 1:38 PM

## 2016-08-05 NOTE — Progress Notes (Signed)
Mitchell Mckee is a 36 year old male being [facility] voluntarily [facility].  He was d/c two days ago and family brought him in for continued mania and delusional statement.  He denies SI/HI or A/V hallucinations.  He was very hyper verbal but was pleasant.  He denies any any pain or discomfort and appears to be in no physical distress.  Oriented him to the unit.  Admission paperwork completed and signed.  Belongings searched and secured in locker # 29.  Skin assessment completed and no skin issues noted except multiple tattoos.  Q 15 minute checks initiated for safety.  We will monitor the progress towards his goals.

## 2016-08-05 NOTE — Progress Notes (Signed)
Adult Psychoeducational Group Note  Date:  08/05/2016 Time:  8:38 PM  Group Topic/Focus:  Wrap-Up Group:   The focus of this group is to help patients review their daily goal of treatment and discuss progress on daily workbooks.  Participation Level:  Active  Participation Quality:  Appropriate  Affect:  Appropriate  Cognitive:  Appropriate  Insight: Appropriate  Engagement in Group:  Engaged  Modes of Intervention:  Discussion  Additional Comments:  The patient expressed that he rates today a 4.  [name], [name] 08/05/2016, 8:38 PM

## 2016-08-06 DIAGNOSIS — F3110 Bipolar disorder, current episode manic without psychotic features, unspecified: Secondary | ICD-10-CM

## 2016-08-06 DIAGNOSIS — F1220 Cannabis dependence, uncomplicated: Secondary | ICD-10-CM

## 2016-08-06 DIAGNOSIS — F17210 Nicotine dependence, cigarettes, uncomplicated: Secondary | ICD-10-CM

## 2016-08-06 NOTE — Progress Notes (Signed)
D: Pt denies SI/ HI/ VH, +ve AH- positive and only when manic. Pt is pleasant and cooperative. Pt stated his family is his trigger and does not know how to cope. Pt blames others, but stated he was stressed from his family which caused him to get back in here. Extensive 1:1 time spent with pt to help him start working on coping skills. Pt was informed to start taking responsibility for the things going on in his life and to get control of the things he could control.   A: Pt was offered support and encouragement. Pt was given scheduled medications. Pt was encourage to attend groups. Q 15 minute checks were done for safety.   R:Pt attends groups and interacts well with peers and staff. Pt is taking medication. Pt has no complaints at this time .Pt receptive to treatment and safety maintained on unit.

## 2016-08-06 NOTE — B[facility] Group Notes (Signed)
B[facility] Group Notes:  (Clinical Social Work)  08/06/2016  11:15-12:00PM  Summary of Progress/Problems:   Today's process group involved patients discussing their feelings related to being hospitalized, as well as benefits they see to being in the hospital.  These were itemized and then the group brainstormed specific ways in which they could seek for those same benefits to happen when they discharge and go back home. The patient expressed a primary feeling about being hospitalized is that he feels safe because he is not judged, likes the food, gets the right medications, and feels good about the prospect of getting on the right medications.  He did tend toward side conversations and was difficult to redirect to listening to other group members.  He became quite focused on how much he benefits from releasing his anger by using a punching bag and how he believes the hospital should have a punching bag available for patients.  Type of Therapy:  Group Therapy - Process  Participation Level:  Active  Participation Quality:  Attentive, Intrusive and Inattentive  Affect:  Blunted  Cognitive:  Disorganized  Insight:  Developing/Improving  Engagement in Therapy:  Developing/Improving  Modes of Intervention:  Exploration, Discussion  Ambrose MantleMareida Grossman-Orr, LCSW 08/06/2016, 12:31 PM
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
## 2016-08-06 NOTE — Progress Notes (Signed)
Reports continued depression and hopelessness without anxiety. Denies suicidal ideation and HI, reports auditory hallucinations "when I'm manic, I hear these voices. My mind goes all the time and I feel fantastic, like I can do anything at all". Patient verbalizing acceptance that he is bipolar and expressing desire for treatment. States that this has happened to him once before. Continues to blame mother and girlfriend for his troubles.

## 2016-08-06 NOTE — Plan of Care (Signed)
Problem: Safety: Goal: Periods of time without injury will increase Outcome: Progressing Pt safe on the unit at this time   

## 2016-08-06 NOTE — Social Work (Signed)
Clinical Social Work Note  Received phone call from Loews CorporationMichaeline Parrish, pt's significant other's mother, [phone].  Pt has signed a form stating she can visit and get information about her.  The following information was shared:  In 8 years that caller has known pt, he has not spoken 500 words.  Now he does not stop talking so fast they can hardly understand him.  He is raging about his family not being on a particular video game with him, "they better be on it with me or else," yelling constantly.  He is trying to force them to sit and watch him play a video game that he claims is going to make a fortune for him.   He is sharing delusions about everyone at the hospital telling him he is supposed to be a Psychologist, prison and probation servicespolice officer or nurse, then claims he is a security guard even though his job is completely different.  He claims God is talking in his ear.    Significant other wants to stress how different his personality is currently.  Significant other is now pregnant, and has a lot of fear of him due to his rages and delusions.  Prior to this, caller had never even heard him raise his voice.  His father was in the ER and told caller he has never seen him rage like this before, that he is "normally the quiet, sweet one."      Pt keeps stating that he has never known he had Bipolar disorder before; however, this is not accurate.  Fifteen years ago pt had a similar incident, [facility].    In reference to pt's hyperfocus on his significant other's bipolar disorder, caller states his significant other does in fact have bipolar disorder, is on medications, and has consulted with psychiatrist within the last week and is stable.  Pt is blaming her for everything, stating that she and his mother are responsible for his current state/problems.  He continues to rage even now on the phone.  Significant other Gean MaidensLeah Parrish will visit tonight, can give feedback about visit, is eager to talk  with doctor/treatment team.  They are asking strongly that their observations of the extreme change in pt's personality be taken into account prior to discharge, and that the medications be observed to be effective prior to him being discharged home.   Ambrose MantleMareida Grossman-Orr, LCSW 08/06/2016, 5:12 PM

## 2016-08-06 NOTE — [facility] Group Notes (Signed)
Guided Meditation, "Working with Anxiety". Patient attended and actively participated.

## 2016-08-06 NOTE — Progress Notes (Signed)
[facility]  08/06/2016 1:37 PM Mitchell MageRyan T Mckee  MRN:  [id number] Subjective:  I had manic episode.  I got confused Objective; patient seen chart reviewed.  Patient was readmitted due to having manic episodes.  Patient told he left the hospital 2 days ago however when he went to pick up his medication he cannot confuse because of different color.  When he went to home he had argument with his girlfriend also has bipolar disorder.  His tend to have medical symptoms, racing thoughts, extreme irritability and started to hear from the guard. Since back on his medication he is doing better.  He sleeping good.  He has no tremors shakes or any EPS.  He denies any hallucination but he cats some time irritability and racing thoughts.  He like to be discharge as soon as possible but realizes he needs some time so can medicine work well.  He is going to the groups. His blood Depakote level was 55 3 days ago.  Principal Problem: Bipolar affective disorder, manic (HCC) Diagnosis:   Patient Active Problem List   Diagnosis Date Noted  . Bipolar 1 disorder, manic, full remission (HCC) [F31.74] 08/05/2016  . Bipolar affective disorder, manic (HCC) [F31.10] 07/30/2016  . Cannabis use disorder, severe, dependence (HCC) [F12.20] 07/29/2016   Total Time spent with patient: 20 minutes  Past Psychiatric History: reviewed.  Past Medical History: History reviewed. No pertinent past medical history. History reviewed. No pertinent surgical history. Family History: History reviewed. No pertinent family history. Family Psychiatric  History: reviewed. Social History:  History  Alcohol Use No     History  Drug Use No    Social History   Social History  . Marital status: Single    Spouse name: N/A  . Number of children: N/A  . Years of education: N/A   Social History Main Topics  . Smoking status: Current Every Day Smoker  . Smokeless tobacco: Never Used  . Alcohol use No  . Drug use: No  . Sexual  activity: Not Asked   Other Topics Concern  . None   Social History Narrative  . None   Additional Social History:    Pain Medications: see PTA meds Prescriptions: see PTA meds Over the Counter: see PTA meds History of alcohol / drug use?: Yes Name of Substance 1: Marijuana 1 - Frequency: daily 1 - Duration: ongoing 1 - Last Use / Amount: last night/a joint                  Sleep: Fair  Appetite:  Fair  Current Medications: Current Facility-Administered Medications  Medication Dose Route Frequency Provider Last Rate Last Dose  . acetaminophen (TYLENOL) tablet 650 mg  650 mg Oral Q6H PRN Okonkwo, Justina A, NP      . alum & mag hydroxide-simeth (MAALOX/MYLANTA) 200-200-20 MG/5ML suspension 30 mL  30 mL Oral Q4H PRN Okonkwo, Justina A, NP      . benztropine (COGENTIN) tablet 1 mg  1 mg Oral QHS Okonkwo, Justina A, NP   1 mg at 08/05/16 2122  . divalproex (DEPAKOTE ER) 24 hr tablet 750 mg  750 mg Oral QHS Okonkwo, Justina A, NP   750 mg at 08/05/16 2122  . hydrOXYzine (ATARAX/VISTARIL) tablet 25 mg  25 mg Oral TID PRN Ferne Reuskonkwo, Justina A, NP   25 mg at 08/06/16 0801  . magnesium hydroxide (MILK OF MAGNESIA) suspension 30 mL  30 mL Oral Daily PRN Beryle Lathekonkwo, Justina A, NP      .  nicotine (NICODERM CQ - dosed in mg/24 hours) patch 21 mg  21 mg Transdermal Daily Okonkwo, Justina A, NP   21 mg at 08/06/16 [id number]  . risperiDONE (RISPERDAL) tablet 3 mg  3 mg Oral QHS Okonkwo, Justina A, NP   3 mg at 08/05/16 2122  . traZODone (DESYREL) tablet 150 mg  150 mg Oral QHS Okonkwo, Justina A, NP        Lab Results: No results found for this or any previous visit (from the past 48 hour(s)).  Blood Alcohol level:  Lab Results  Component Value Date   ETH <5 07/28/2016    Metabolic Disorder Labs: Lab Results  Component Value Date   HGBA1C 5.0 08/01/2016   MPG 97 08/01/2016   No results found for: PROLACTIN Lab Results  Component Value Date   CHOL 106 08/01/2016   TRIG 80  08/01/2016   HDL 37 (L) 08/01/2016   CHOLHDL 2.9 08/01/2016   VLDL 16 08/01/2016   LDLCALC 53 08/01/2016    Physical Findings: AIMS: Facial and Oral Movements Muscles of Facial Expression: None, normal Lips and Perioral Area: None, normal Jaw: None, normal Tongue: None, normal,Extremity Movements Upper (arms, wrists, hands, fingers): None, normal Lower (legs, knees, ankles, toes): None, normal, Trunk Movements Neck, shoulders, hips: None, normal, Overall Severity Severity of abnormal movements (highest score from questions above): None, normal Incapacitation due to abnormal movements: None, normal Patient's awareness of abnormal movements (rate only patient's report): No Awareness, Dental Status Current problems with teeth and/or dentures?: No Does patient usually wear dentures?: No  CIWA:    COWS:     Musculoskeletal: Strength & Muscle Tone: within normal limits Gait & Station: normal Patient leans: N/A  Psychiatric Specialty Exam: Physical Exam  Review of Systems  Constitutional: Negative.   HENT: Negative.   Eyes: Negative.   Respiratory: Negative.   Cardiovascular: Negative.   Musculoskeletal: Negative.   Skin: Negative.   Neurological: Negative.   Psychiatric/Behavioral: The patient is nervous/anxious.     Blood pressure 124/82, pulse (!) 104, temperature 97.6 F (36.4 C), temperature source Oral, resp. rate 16, height 5\' 8"  (1.727 m), weight 65.3 kg (144 lb), SpO2 100 %.Body mass index is 21.9 kg/m.  General Appearance: Fairly Groomed  Eye Contact:  Fair  Speech:  fast  Volume:  Normal  Mood:  Euphoric  Affect:  Labile  Thought Process:  Goal Directed  Orientation:  Full (Time, Place, and Person)  Thought Content:  Rumination  Suicidal Thoughts:  No  Homicidal Thoughts:  No  Memory:  Immediate;   Good Recent;   Good Remote;   Good  Judgement:  Good  Insight:  Good  Psychomotor Activity:  Normal  Concentration:  Concentration: Fair and Attention  Span: Fair  Recall:  [employer]Fair  Fund of Knowledge:  Good  Language:  Good  Akathisia:  No  Handed:  Right  AIMS (if indicated):     Assets:  Communication Skills Desire for Improvement Housing Social Support  ADL's:  Intact  Cognition:  WNL  Sleep:  Number of Hours: 3.75     Treatment Plan Summary: Daily contact with patient to assess and evaluate symptoms and progress in treatment and Medication management Patient is doing better on his current medication.  Continue Depakote 750 mg at bedtime, Cogentin 1 mg at bedtime and Risperdal3 mg at bedtime.  He has no side effects including any tremors or shakes.  Continue trazodone 150 mg at bedtime.  Encouraged to participate in group  milieu therapy.  We will do a Depakote level tomorrow morning. Social worker start disposition plan.  Baneen Wieseler T., MD 08/06/2016, 1:37 PM

## 2016-08-06 NOTE — [facility] Group Notes (Signed)
[facility] Group Notes:  (Nursing/MHT/Case Management/Adjunct)  Date:  08/06/2016  Time:  1:04 PM  Type of Therapy:  Psychoeducational Skills  Participation Level:  Active  Participation Quality:  Appropriate  Affect:  Appropriate  Cognitive:  Appropriate  Insight:  Appropriate  Engagement in Group:  Engaged  Modes of Intervention:  Discussion  Summary of Progress/Problems: Pt did attend self inventory group.   Jacquelyne BalintForrest, Adina Puzzo Shanta 08/06/2016, 1:04 PM

## 2016-08-06 NOTE — Progress Notes (Signed)
Adult Psychoeducational Group Note  Date:  08/06/2016 Time:  8:51 PM  Group Topic/Focus:  Wrap-Up Group:   The focus of this group is to help patients review their daily goal of treatment and discuss progress on daily workbooks.  Participation Level:  Active  Participation Quality:  Appropriate  Affect:  Appropriate  Cognitive:  Appropriate  Insight: Appropriate  Engagement in Group:  Engaged  Modes of Intervention:  Discussion  Additional Comments:  The patient expressed that he rates today a 8.The patient also said that he enjoyed meditation.  [name], Camika Marsico Lee 08/06/2016, 8:51 PM

## 2016-08-06 NOTE — Progress Notes (Signed)
Writer spoke with patient 1;1 shortly after his visitors left. He reports that he just found out 2 days ago that he was diagnosed Bipolar and is trying to work on getting his medications right because his girlfriend is Bipolar also and pregnant. He reports that he is going to do what he needs to do as far as taking his medications and attending groups so that he can be discharged. Writer informed him of medications scheduled and he reported that he did not want to take his trazadone because it made him feel very tired and sleepy the next day. Support given and safety maintained on unit with 15 min checks.

## 2016-08-07 LAB — VALPROIC ACID LEVEL: Valproic Acid Lvl: 46 ug/mL — ABNORMAL LOW (ref 50.0–100.0)

## 2016-08-07 MED ORDER — DIVALPROEX SODIUM ER 500 MG PO TB24
1000.0000 mg | ORAL_TABLET | Freq: Every day | ORAL | Status: DC
  Administered 2016-08-07 – 2016-08-10 (×4): 1000 mg via ORAL
  Filled 2016-08-07 (×6): qty 2

## 2016-08-07 NOTE — [facility] Group Notes (Signed)
[facility] Group Notes:  (Clinical Social Work)  08/07/2016  11:00AM-12:00PM  Summary of Progress/Problems:  The main focus of today's process group was to listen to a variety of genres of music and to identify that different types of music provoke different responses.  The patient then was able to identify personally what was soothing for them, as well as energizing, as well as how patient can personally use this knowledge in sleep habits, with depression, and with other symptoms.  The patient expressed at the beginning of group the overall feeling of "better than I have been because you spent time with me yesterday, but agitated because my friends here are agitated."  At the end of group he stated again that he feels better because of time spent with CSW yesterday, rather than attributing it to the music.  He talked much of group, had to be redirected several times.  Type of Therapy:  Music Therapy   Participation Level:  Active  Participation Quality:  Attentive and Distracting  Affect:  Blunted  Cognitive:  Disorganized  Insight:  Limited  Engagement in Therapy:  Improving  Modes of Intervention:   Activity, Exploration  Mitchell MantleMareida Grossman-Orr, LCSW 08/07/2016

## 2016-08-07 NOTE — Progress Notes (Signed)
Remains grandiose, but with more subdued affect. Intrusive with peers and staff.

## 2016-08-07 NOTE — Progress Notes (Signed)
[place], [place]'s girlfriend that he has lived with for 8 years and who is [redacted] weeks pregnant. She called after getting off the phone with him, stating that he was saying very upsetting things and she was very afraid that he would be discharged immediately upon the end of the 72h IVC period.   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   She states that he was extremely aggressive with her the night that he was IVC'd and admitted the second time, stating that "someone would be calling the police for domestic violence" if her mother had not removed her from the home.  He told her that he is getting Depakote here, which is a code word for marijuana, and that staff agrees that marijuana is the best medication for him.  Alycia RossettiRyan had a manic/psychotic episode 15 years ago without any symptoms in between, but that episode coincided with the birth of his son. When he found out that his GF was pregnant was when this episode began.   Family is asking for a face to face [facility] before the patient is discharged. Note entered into 'sticky notes' and Mareida, SW notified to communicate to weekday SW and MD staff.

## 2016-08-07 NOTE — Progress Notes (Addendum)
[facility]  08/07/2016 10:14 AM Lars MageRyan T Evola  MRN:  [id number] Subjective:  I am excited and happy.  Objective; patient seen chart reviewed.  He is taking Depakote 750 mg at bedtime.  His level came down to 43.  He admitted some time poor sleep and he feels very happy and excited.  His energy level is increased.  He has no tremors or shakes.  Though he is feeling good but he continues to have manic-like symptoms.  He is easily distracted and his his speech remains very fast and pressured at times.  He admitted some time having racing thoughts, too much excitement and also endorse at times having hallucination from the God.  However he denies any suicidal thoughts or homicidal thought.  He is going to the groups. He is not disruptive in the unit. . Principal Problem: Bipolar affective disorder, manic (HCC) Diagnosis:   Patient Active Problem List   Diagnosis Date Noted  . Bipolar 1 disorder, manic, full remission (HCC) [F31.74] 08/05/2016  . Bipolar affective disorder, manic (HCC) [F31.10] 07/30/2016  . Cannabis use disorder, severe, dependence (HCC) [F12.20] 07/29/2016   Total Time spent with patient: 20 minutes  Past Psychiatric History: reviewed.  Past Medical History: History reviewed. No pertinent past medical history. History reviewed. No pertinent surgical history. Family History: History reviewed. No pertinent family history. Family Psychiatric  History: reviewed. Social History:  History  Alcohol Use No     History  Drug Use No    Social History   Social History  . Marital status: Single    Spouse name: N/A  . Number of children: N/A  . Years of education: N/A   Social History Main Topics  . Smoking status: Current Every Day Smoker  . Smokeless tobacco: Never Used  . Alcohol use No  . Drug use: No  . Sexual activity: Not Asked   Other Topics Concern  . None   Social History Narrative  . None   Additional Social History:    Pain Medications:  see PTA meds Prescriptions: see PTA meds Over the Counter: see PTA meds History of alcohol / drug use?: Yes Name of Substance 1: Marijuana 1 - Frequency: daily 1 - Duration: ongoing 1 - Last Use / Amount: last night/a joint                  Sleep: Fair  Appetite:  Fair  Current Medications: Current Facility-Administered Medications  Medication Dose Route Frequency Provider Last Rate Last Dose  . acetaminophen (TYLENOL) tablet 650 mg  650 mg Oral Q6H PRN Okonkwo, Justina A, NP      . alum & mag hydroxide-simeth (MAALOX/MYLANTA) 200-200-20 MG/5ML suspension 30 mL  30 mL Oral Q4H PRN Okonkwo, Justina A, NP      . benztropine (COGENTIN) tablet 1 mg  1 mg Oral QHS Okonkwo, Justina A, NP   1 mg at 08/06/16 2251  . divalproex (DEPAKOTE ER) 24 hr tablet 1,000 mg  1,000 mg Oral QHS Josue Kass T, MD      . hydrOXYzine (ATARAX/VISTARIL) tablet 25 mg  25 mg Oral TID PRN Beryle Lathekonkwo, Justina A, NP   25 mg at 08/07/16 0105  . magnesium hydroxide (MILK OF MAGNESIA) suspension 30 mL  30 mL Oral Daily PRN Okonkwo, Justina A, NP      . nicotine (NICODERM CQ - dosed in mg/24 hours) patch 21 mg  21 mg Transdermal Daily Okonkwo, Justina A, NP   21 mg at  08/07/16 0739  . risperiDONE (RISPERDAL) tablet 3 mg  3 mg Oral QHS Okonkwo, Justina A, NP   3 mg at 08/06/16 2251  . traZODone (DESYREL) tablet 150 mg  150 mg Oral QHS Okonkwo, Justina A, NP        Lab Results:  Results for orders placed or performed during the hospital encounter of 08/05/16 (from the past 48 hour(s))  Valproic acid level     Status: Abnormal   Collection Time: 08/07/16  6:08 AM  Result Value Ref Range   Valproic Acid Lvl 46 (L) 50.0 - 100.0 ug/mL    Comment: Performed at [facility], [address], [place], [place] [address]    Blood Alcohol level:  Lab Results  Component Value Date   ETH <5 07/28/2016    Metabolic Disorder Labs: Lab Results  Component Value Date   HGBA1C 5.0 08/01/2016   MPG 97  08/01/2016   No results found for: PROLACTIN Lab Results  Component Value Date   CHOL 106 08/01/2016   TRIG 80 08/01/2016   HDL 37 (L) 08/01/2016   CHOLHDL 2.9 08/01/2016   VLDL 16 08/01/2016   LDLCALC 53 08/01/2016    Physical Findings: AIMS: Facial and Oral Movements Muscles of Facial Expression: None, normal Lips and Perioral Area: None, normal Jaw: None, normal Tongue: None, normal,Extremity Movements Upper (arms, wrists, hands, fingers): None, normal Lower (legs, knees, ankles, toes): None, normal, Trunk Movements Neck, shoulders, hips: None, normal, Overall Severity Severity of abnormal movements (highest score from questions above): None, normal Incapacitation due to abnormal movements: None, normal Patient's awareness of abnormal movements (rate only patient's report): No Awareness, Dental Status Current problems with teeth and/or dentures?: No Does patient usually wear dentures?: No  CIWA:    COWS:     Musculoskeletal: Strength & Muscle Tone: within normal limits Gait & Station: normal Patient leans: N/A  Psychiatric Specialty Exam: Physical Exam  Review of Systems  Constitutional: Negative.   HENT: Negative.   Eyes: Negative.   Respiratory: Negative.   Cardiovascular: Negative.   Genitourinary: Negative.   Musculoskeletal: Negative.   Skin: Negative.   Neurological: Negative for tremors.  Psychiatric/Behavioral: The patient is nervous/anxious and has insomnia.     Blood pressure 101/69, pulse 94, temperature 98.5 F (36.9 C), temperature source Oral, resp. rate 18, height 5\' 8"  (1.727 m), weight 65.3 kg (144 lb), SpO2 100 %.Body mass index is 21.9 kg/m.  General Appearance: Fairly Groomed  Eye Contact:  Good  Speech:  fast an rambling at times  Volume:  Increased  Mood:  Euphoric  Affect:  Labile  Thought Process:  Descriptions of Associations: Circumstantial  Orientation:  Full (Time, Place, and Person)  Thought Content:  Rumination and flight of  ideas  Suicidal Thoughts:  No  Homicidal Thoughts:  No  Memory:  Immediate;   Good Recent;   Good Remote;   Good  Judgement:  Fair  Insight:  Fair  Psychomotor Activity:  Increased  Concentration:  Concentration: Fair and Attention Span: Fair  Recall:  Good  Fund of Knowledge:  Good  Language:  Good  Akathisia:  No  Handed:  Right  AIMS (if indicated):     Assets:  Communication Skills Desire for Improvement Housing  ADL's:  Intact  Cognition:  WNL  Sleep:  Number of Hours: 3.75     Treatment Plan Summary: Daily contact with patient to assess and evaluate symptoms and progress in treatment and Medication management  Patient continued to exhibit symptoms of mania.  Despite taking Depakote, Risperdal he is still have mood lability.  He appears a euphoric at times.  He has no tremors shakes or any EPS.  His Depakote level is 43.  I would increase Depakote thousand milligram at bedtime.  Continue Risperdal 3 mg at bedtime and Cogentin 1 mg at bedtime.  Encouraged to participate in group milieu therapy.  Social worker continue disposition planning.  Ileigh Mettler T., MD 08/07/2016, 10:14 AM

## 2016-08-08 DIAGNOSIS — F419 Anxiety disorder, unspecified: Secondary | ICD-10-CM

## 2016-08-08 DIAGNOSIS — F39 Unspecified mood [affective] disorder: Secondary | ICD-10-CM

## 2016-08-08 DIAGNOSIS — G4700 Insomnia, unspecified: Secondary | ICD-10-CM

## 2016-08-08 NOTE — Progress Notes (Signed)
Writer has observed patient up in the dayroom watching tv or either talking with his room mate in the dayroom. He has made the comment a few times tonight to Clinical research associatewriter that he is ready to discharge and needing to return to work so that he can pay his bills. He reports that he checked himself in and feels that he is better and is hoping to discharge on tomorrow. He still refuses his trazadone and feels that he does not need it. He has had minimal sleep from the previous night and reports that he feels great. Support given and safety maintained on unit with 15 min checks.

## 2016-08-08 NOTE — Progress Notes (Signed)
  [facility] :  Will you be returning to the same living situation after discharge:  Yes,  home At discharge, do you have transportation home?: Yes,  [place] Do you have the ability to pay for your medications: Yes,  insurance  Release of information consent forms completed and in the chart;  Patient's signature needed at discharge.  Patient to Follow up at: Follow-up Information    [facility] on 08/16/2016.   Why:  Tuesday at 11:00 with Sheria Langameron, therapist.  Call them after you d/c to confirm you can make that appointment, give them an email address and pay a $20.00 deposit. Your psychiatrist appointment is Wed., September 12 at 11:00.  Contact information: [address] [place] [id number] [phone]           Next level of care provider has access to [facility] Link:no  Safety Planning and Suicide Prevention discussed: Yes,  yes  Have you used any form of tobacco in the last 30 days? (Cigarettes, Smokeless Tobacco, Cigars, and/or Pipes): No  Has patient been referred to the Quitline?: N/A patient is not a smoker  Patient has been referred for addiction treatment: Pt. refused referral  Ida RogueRodney B Stone Spirito, LCSW 08/08/2016, 11:09 AM

## 2016-08-08 NOTE — Progress Notes (Signed)
Recreation Therapy Notes  Date: 08/08/2016 Time: 10:00am Location: [address] Dayroom  Group Topic: Coping Skills  Goal Area(s) Addresses:  Pt will be able to successfully identify at least 6 positive coping skills.  Behavioral Response: Engaged  Intervention: Art/Worksheet  Activity: Pt will identify coping skills in the following domains: self-soothing,distraction, opposite action, emotional awareness,mindfulness and crisis plan. Pt will share one coping skill from each category.  Education: PharmacologistCoping Skills, Discharge Planning  Education Outcome: Acknowledges understanding  Clinical Observations/Feedback: Pt actively participated in activity. Approximately 10:25am pt left group and approximately 10:30am returned to group. Pt identified the following coping skills: video games, take a break, know issues, meditation and talking to friends and family.   Marvell Fullerachel Meyer, Recreation Therapy Intern   Caroll RancherMarjette Jaycob Mcclenton, LRT/CTRS        Marvell FullerRachel Meyer 08/08/2016 11:33 AM

## 2016-08-08 NOTE — [facility] Group Notes (Signed)
[facility] LCSW Group Therapy  08/08/2016 1:15 pm  Type of Therapy: Process Group Therapy  Participation Level:  Active  Participation Quality:  Appropriate  Affect:  Flat  Cognitive:  Oriented  Insight:  Improving  Engagement in Group:  Limited  Engagement in Therapy:  Limited  Modes of Intervention:  Activity, Clarification, Education, Problem-solving and Support  Summary of Progress/Problems: Today's group addressed the issue of overcoming obstacles.  Patients were asked to identify their biggest obstacle post d/c that stands in the way of their on-going success, and then problem solve as to how to manage this. Mitchell Mckee stayed the entire time, engaged throughout.  Talked about finding out that he is diagnosed with Bi-polar D/O, and trying to find out what that means going forward in terms of his mental health. He stated his girlfriend his diagnosed with Bipolar D/O as well, and he does not like getting help from her since he has always been the caretaker.   Mitchell Mckee, Mitchell Mckee 08/08/2016   3:46 PM

## 2016-08-08 NOTE — Progress Notes (Signed)
Patient ID: Mitchell Mckee, male   DOB: 06/30/1980, 36 y.o.   MRN: [id number] D  --  Pt agrees to contract for safety and denies pain .  He is calm and cooperative and shows no negative behaviors.  Pt sits in dayroom interacting with peers.  He shows no mania or delusions.  Pt has good insight when talking to peers.  He has attended groups on the unit and has gone to the [employer] and cafeteria.  Pt shows no sign of adverse effects to his medications.  ---- A ---  Provide support and safety  --- R ---   Pt remains safe on unit

## 2016-08-08 NOTE — Progress Notes (Signed)
The focus of this group is to help patients review their daily goal of treatment and discuss progress on daily workbooks. Pt attended the evening group session and responded to all discussion prompts from the Writer. Pt shared that today was a generally good day on the unit, the highlight of which was a visit from his girlfriend and a family member.  Pt did express some frustration at having continued blood draws in the mornings, but also reported being agreeable to whatever requests are made of him in working toward his discharge.  Pt rated his day an 8 out of 10 and his affect was appropriate.

## 2016-08-08 NOTE — Progress Notes (Signed)
Patient resting in bed with eyes closed; respirations regular and unlabored. No signs of distress noted at this time.

## 2016-08-08 NOTE — Progress Notes (Signed)
[facility] MD Progress Note  08/08/2016 10:43 AM Mitchell Mckee  MRN:  [id number]  Subjective: Mitchell Mckee reports, "I'm feeling great. I need to be discharged to my home. I got some stuff I need to do. I got to get back to work, there are bills to be paid. I got a baby on the way. I got a little manic after leaving this hospital last week because I was trying to figure my medicines out. That led to be getting a little confused.  Objective: Mitchell Mckee is seen & chart reviewed. Hecontinues to exhibit symptoms of hypo-mania (pressured speech, racing thoughts). He remains on the Depakote & Risperdal. He presents euphoric. He has no tremors shakes or any EPS.  His Depakote level is 43. The Depakote dose was increased yesterday to a 1,000 mg Q hs.   Continue Risperdal 3 mg at bedtime and Cogentin 1 mg at bedtime.  Encouraged to participate in group milieu therapy. Social worker continue disposition planning. Mitchell Mckee is visible on the unit, participating in the group milieu. He denies any new issues. Staff continues provided support. . Principal Problem: Bipolar affective disorder, manic (HCC)  Diagnosis:   Patient Active Problem List   Diagnosis Date Noted  . Bipolar 1 disorder, manic, full remission (HCC) [F31.74] 08/05/2016  . Bipolar affective disorder, manic (HCC) [F31.10] 07/30/2016  . Cannabis use disorder, severe, dependence (HCC) [F12.20] 07/29/2016   Total Time spent with patient: 15 minutes  Past Psychiatric History: Bipolar disorder.  Past Medical History: History reviewed. No pertinent past medical history. History reviewed. No pertinent surgical history.  Family History: History reviewed. No pertinent family history.  Family Psychiatric  History: See H&P  Social History:  History  Alcohol Use No     History  Drug Use No    Social History   Social History  . Marital status: Single    Spouse name: N/A  . Number of children: N/A  . Years of education: N/A   Social History Main Topics  .  Smoking status: Current Every Day Smoker  . Smokeless tobacco: Never Used  . Alcohol use No  . Drug use: No  . Sexual activity: Not Asked   Other Topics Concern  . None   Social History Narrative  . None   Additional Social History:    Pain Medications: see PTA meds Prescriptions: see PTA meds Over the Counter: see PTA meds History of alcohol / drug use?: Yes Name of Substance 1: Marijuana 1 - Frequency: daily 1 - Duration: ongoing 1 - Last Use / Amount: last night/a joint  Sleep: Fair  Appetite:  Fair  Current Medications: Current Facility-Administered Medications  Medication Dose Route Frequency Provider Last Rate Last Dose  . acetaminophen (TYLENOL) tablet 650 mg  650 mg Oral Q6H PRN Okonkwo, Justina A, NP      . alum & mag hydroxide-simeth (MAALOX/MYLANTA) 200-200-20 MG/5ML suspension 30 mL  30 mL Oral Q4H PRN Okonkwo, Justina A, NP      . benztropine (COGENTIN) tablet 1 mg  1 mg Oral QHS Okonkwo, Justina A, NP   1 mg at 08/07/16 2204  . divalproex (DEPAKOTE ER) 24 hr tablet 1,000 mg  1,000 mg Oral QHS Arfeen, Phillips Grout, MD   1,000 mg at 08/07/16 2204  . hydrOXYzine (ATARAX/VISTARIL) tablet 25 mg  25 mg Oral TID PRN Beryle Lathe, Justina A, NP   25 mg at 08/07/16 2204  . magnesium hydroxide (MILK OF MAGNESIA) suspension 30 mL  30 mL Oral Daily PRN  Beryle Lathekonkwo, Justina A, NP      . nicotine (NICODERM CQ - dosed in mg/24 hours) patch 21 mg  21 mg Transdermal Daily Okonkwo, Justina A, NP   21 mg at 08/08/16 0749  . risperiDONE (RISPERDAL) tablet 3 mg  3 mg Oral QHS Okonkwo, Justina A, NP   3 mg at 08/07/16 2204  . traZODone (DESYREL) tablet 150 mg  150 mg Oral QHS Okonkwo, Justina A, NP        Lab Results:  Results for orders placed or performed during the hospital encounter of 08/05/16 (from the past 48 hour(s))  Valproic acid level     Status: Abnormal   Collection Time: 08/07/16  6:08 AM  Result Value Ref Range   Valproic Acid Lvl 46 (L) 50.0 - 100.0 ug/mL    Comment: Performed  at [facility], [address] [place], [place] [address]    Blood Alcohol level:  Lab Results  Component Value Date   ETH <5 07/28/2016   Metabolic Disorder Labs: Lab Results  Component Value Date   HGBA1C 5.0 08/01/2016   MPG 97 08/01/2016   No results found for: PROLACTIN Lab Results  Component Value Date   CHOL 106 08/01/2016   TRIG 80 08/01/2016   HDL 37 (L) 08/01/2016   CHOLHDL 2.9 08/01/2016   VLDL 16 08/01/2016   LDLCALC 53 08/01/2016   Physical Findings: AIMS: Facial and Oral Movements Muscles of Facial Expression: None, normal Lips and Perioral Area: None, normal Jaw: None, normal Tongue: None, normal,Extremity Movements Upper (arms, wrists, hands, fingers): None, normal Lower (legs, knees, ankles, toes): None, normal, Trunk Movements Neck, shoulders, hips: None, normal, Overall Severity Severity of abnormal movements (highest score from questions above): None, normal Incapacitation due to abnormal movements: None, normal Patient's awareness of abnormal movements (rate only patient's report): No Awareness, Dental Status Current problems with teeth and/or dentures?: No Does patient usually wear dentures?: No  CIWA:    COWS:     Musculoskeletal: Strength & Muscle Tone: within normal limits Gait & Station: normal Patient leans: N/A  Psychiatric Specialty Exam: Physical Exam  Review of Systems  Constitutional: Negative.   HENT: Negative.   Eyes: Negative.   Respiratory: Negative.   Cardiovascular: Negative.   Genitourinary: Negative.   Musculoskeletal: Negative.   Skin: Negative.   Psychiatric/Behavioral: Positive for substance abuse (Hx. THC use disorder). Negative for suicidal ideas. The patient is nervous/anxious and has insomnia.     Blood pressure 104/71, pulse (!) 101, temperature (!) 97.4 F (36.3 C), temperature source Oral, resp. rate 18, height 5\' 8"  (1.727 m), weight 65.3 kg (144 lb), SpO2 100 %.Body mass index is 21.9  kg/m.  General Appearance: Fairly Groomed  Eye Contact:  Good  Speech:  Fast,  rambling at times, presuured speech.  Volume:  Increased  Mood:  Euphoric  Affect:  Labile  Thought Process:  Coherent and Descriptions of Associations: Circumstantial  Orientation:  Full (Time, Place, and Person)  Thought Content:  Rumination and flight of ideas  Suicidal Thoughts:  Denies any thoughts, plans or intent.  Homicidal Thoughts:  Denies any thoughts, plans or intent.  Memory:  Immediate;   Good Recent;   Good Remote;   Good  Judgement:  Fair  Insight:  Fair  Psychomotor Activity:  Increased  Concentration:  Concentration: Fair and Attention Span: Fair  Recall:  Good  Fund of Knowledge:  Good  Language:  Good  Akathisia:  No  Handed:  Right  AIMS (if indicated):     Assets:  Communication Skills Desire for Improvement Housing  ADL's:  Intact  Cognition:  WNL  Sleep:  Number of Hours: 3   Treatment Plan Summary: Patient is approaching his baseline. No evidence of psychosis. Remains with evidence of hypo-mania. No dangerousness. We are continuing his care while social worker is working on finalizing aftercare    Psychiatric: Bipolar affective disorder, manic episodes.  Medical: Will continue monitor for any symptoms & treat on a prn basis.  Psychosocial:  Employed . Will encourage group counseling attendance & participation.  PLAN: 1.08-08-16: No changes made on the current plan of care, continue current regimen as recommended.  Anxiety: Continue Hydroxyzine 25 mg prn Q 6 hours.  EPS: Continue Benztropine 1 mg Q hs.  Mood control: Continue Risperdal 3 mg Q bedtime.  Mood Stabilization:  Continue Depakote ER 1,000 mg Q hs. Reviewed labL Depakote level 08-07-16 46.  Insomnia: Continue Trazodone 150 mg Q hs.  Nicotine Withdrawal symptoms: Will continue the nicotine patch 21 mg Q 24 hours..  2. Continue to monitor mood, behavior and interaction with  peers  Sanjuana KavaNwoko, Agnes I, NP, PMHNP, FNP-BC 08/08/2016, 10:43 AMPatient ID: Lars Mageyan T Mefferd, male   DOB: Jan 25, 1980, 36 y.o.   MRN: [id number]

## 2016-08-08 NOTE — Progress Notes (Signed)
Recreation Therapy Notes  Patient admitted to unit 08/05/2016. Due to admission within last year, no new assessment conducted at this time. Last assessment conducted 08/01/2016. Patient reports there are no changes in stressors from previous admission. Patient reports catalyst for his admission is because on his last admission he was informed that he was bipolar. Pt voluntarily admitted himself because he wanted to make sure his medication was correct.  Pt identified that his communication with his family is improving and their relationship is also getting better.?  Patient denies SI, HI, AVH at this time. Patient reports goal of "wanting to get home and fix stuff and get better." ? Information found below from assessment conducted 08/01/2016/  Pt reported that his coping skills are substance abuse, exercise, talking, music, sports and video games.  Pt reported that his personal challenges are communication and social interaction.  Pt reported his leisure interests as video games and "smoke weed."  Pt identified the community resources he uses are the mall and [employer] [employer] [facility].  Mitchell Fullerachel Mckee, Recreation Therapy Intern   Mitchell Mckee, LRT/CTRS     Mitchell FullerRachel Mckee 08/08/2016 11:06 AM

## 2016-08-09 NOTE — H&P (Signed)
Psychiatric Admission Assessment Adult  Patient Identification: Mitchell Mckee  MRN:  [id number]  Date of Evaluation:  08/05/2016  Chief Complaint:  BIPOLAR 1  Principal Diagnosis: Bipolar affective disorder, manic (HCC)  Diagnosis:   Patient Active Problem List   Diagnosis Date Noted  . Bipolar 1 disorder, manic, full remission (HCC) [F31.74] 08/05/2016  . Bipolar affective disorder, manic (HCC) [F31.10] 07/30/2016  . Cannabis use disorder, severe, dependence (HCC) [F12.20] 07/29/2016   History of Present Illness: (Per TTS): Mitchell Mckee is an 36 y.o. male who arrived voluntarily to [facility] accompanied by GF and GF's mother with complaints of hyper manic behavior. Patient actively exhibiting racing thoughts with psychomotor agitation. Patient denies SI/HI, endorsing hearing from God. Patient was recently d/c from [facility] 2 days ago.   Associated Signs/Symptoms: Depression Symptoms:  denies  (Hypo) Manic Symptoms:  Elevated Mood, Flight of Ideas, Grandiosity,  Anxiety Symptoms:  denies  Psychotic Symptoms:  denies  PTSD Symptoms: Difficult to assess due to tangential thought process  Total Time spent with patient: 45 minutes  Past Psychiatric History: He has seen psychiatrist before, unable to elaborate it, unknown admission  Is the patient at risk to self? No.  Has the patient been a risk to self in the past 6 months? No.  Has the patient been a risk to self within the distant past? No.  Is the patient a risk to others? No.  Has the patient been a risk to others in the past 6 months? No.  Has the patient been a risk to others within the distant past? No.   Prior Inpatient Therapy: Prior Inpatient Therapy: Yes Prior Therapy Dates: 07/2016 Prior Therapy Facilty/Provider(s): [facility] Reason for Treatment: bipolar  Prior Outpatient Therapy: Prior Outpatient Therapy: No Does patient have an ACCT team?: No Does patient have Intensive In-House Services?  : No Does patient have [facility]  services? : No Does patient have P4CC services?: No  Alcohol Screening: 1. How often do you have a drink containing alcohol?: Monthly or less 2. How many drinks containing alcohol do you have on a typical day when you are drinking?: 1 or 2 3. How often do you have six or more drinks on one occasion?: Never Preliminary Score: 0 9. Have you or someone else been injured as a result of your drinking?: No 10. Has a relative or friend or a doctor or another health worker been concerned about your drinking or suggested you cut down?: No Alcohol Use Disorder Identification Test Final Score (AUDIT): 1 Brief Intervention: AUDIT score less than 7 or less-screening does not suggest unhealthy drinking-brief intervention not indicated  Substance Abuse History in the last 12 months:  Yes.    Consequences of Substance Abuse: manic episode this time  Previous Psychotropic Medications:   Psychological Evaluations: Unable to elaborate due to his mental state  Past Medical History: History reviewed. No pertinent past medical history. History reviewed. No pertinent surgical history.  Family History: History reviewed. No pertinent family history.  Family Psychiatric  History: Unable to elaborate due to his mental state  Tobacco Screening: Have you used any form of tobacco in the last 30 days? (Cigarettes, Smokeless Tobacco, Cigars, and/or Pipes): No Tobacco use, Select all that apply: 5 or more cigarettes per day Are you interested in Tobacco Cessation Medications?: Yes, will notify MD for an order Counseled patient on smoking cessation including recognizing danger situations, developing coping skills and basic information about quitting provided: Refused/Declined practical counseling  Social History:  History  Alcohol Use No     History  Drug Use No    Additional Social History: Marital status: Long term relationship    Unable to elaborate due to his mental state Pain Medications: see PTA  meds Prescriptions: see PTA meds Over the Counter: see PTA meds History of alcohol / drug use?: Yes Name of Substance 1: Marijuana 1 - Frequency: daily 1 - Duration: ongoing 1 - Last Use / Amount: last night/a joint  Allergies:  No Known Allergies  Lab Results: No results found for this or any previous visit (from the past 48 hour(s)).  Blood Alcohol level:  Lab Results  Component Value Date   ETH <5 07/28/2016   Metabolic Disorder Labs:  Lab Results  Component Value Date   HGBA1C 5.0 08/01/2016   MPG 97 08/01/2016   No results found for: PROLACTIN Lab Results  Component Value Date   CHOL 106 08/01/2016   TRIG 80 08/01/2016   HDL 37 (L) 08/01/2016   CHOLHDL 2.9 08/01/2016   VLDL 16 08/01/2016   LDLCALC 53 08/01/2016   Current Medications: Current Facility-Administered Medications  Medication Dose Route Frequency Provider Last Rate Last Dose  . acetaminophen (TYLENOL) tablet 650 mg  650 mg Oral Q6H PRN Okonkwo, Justina A, NP      . alum & mag hydroxide-simeth (MAALOX/MYLANTA) 200-200-20 MG/5ML suspension 30 mL  30 mL Oral Q4H PRN Okonkwo, Justina A, NP      . benztropine (COGENTIN) tablet 1 mg  1 mg Oral QHS Okonkwo, Justina A, NP   1 mg at 08/08/16 2223  . divalproex (DEPAKOTE ER) 24 hr tablet 1,000 mg  1,000 mg Oral QHS Arfeen, Syed T, MD   1,000 mg at 08/08/16 2223  . hydrOXYzine (ATARAX/VISTARIL) tablet 25 mg  25 mg Oral TID PRN Ferne Reus A, NP   25 mg at 08/07/16 2204  . magnesium hydroxide (MILK OF MAGNESIA) suspension 30 mL  30 mL Oral Daily PRN Okonkwo, Justina A, NP      . nicotine (NICODERM CQ - dosed in mg/24 hours) patch 21 mg  21 mg Transdermal Daily Okonkwo, Justina A, NP   21 mg at 08/09/16 0758  . risperiDONE (RISPERDAL) tablet 3 mg  3 mg Oral QHS Okonkwo, Justina A, NP   3 mg at 08/08/16 2223  . traZODone (DESYREL) tablet 150 mg  150 mg Oral QHS Okonkwo, Justina A, NP       PTA Medications: Prescriptions Prior to Admission  Medication Sig  Dispense Refill Last Dose  . benztropine (COGENTIN) 1 MG tablet Take 1 tablet (1 mg total) by mouth at bedtime. For prevention of tremors 30 tablet 0   . divalproex (DEPAKOTE ER) 250 MG 24 hr tablet Take 3 tablets (750 mg total) by mouth at bedtime. For mood stabilization 90 tablet 0   . hydrOXYzine (ATARAX/VISTARIL) 50 MG tablet Take 1 tablet (50 mg total) by mouth 3 (three) times daily. For anxiety 60 tablet 0   . nicotine (NICODERM CQ - DOSED IN MG/24 HOURS) 21 mg/24hr patch Place 1 patch (21 mg total) onto the skin daily. For smoking cessation 28 patch 0   . risperiDONE (RISPERDAL) 3 MG tablet Take 1 tablet (3 mg total) by mouth at bedtime. For mood control 30 tablet 0   . traZODone (DESYREL) 150 MG tablet Take 1 tablet (150 mg total) by mouth at bedtime. For sleep 30 tablet 0    Musculoskeletal: Strength & Muscle Tone: within normal limits Gait & Station:  normal Patient leans: N/A  Psychiatric Specialty Exam: Physical Exam  Nursing note and vitals reviewed. Constitutional: He is oriented to person, place, and time. He appears well-developed.  HENT:  Head: Normocephalic.  Eyes: Pupils are equal, round, and reactive to light.  Neck: Normal range of motion.  Cardiovascular: Normal rate.   Respiratory: Effort normal.  GI: Soft.  Genitourinary:  Genitourinary Comments: Deferred  Musculoskeletal: Normal range of motion.  Neurological: He is alert and oriented to person, place, and time.  Skin: Skin is warm.    Review of Systems  Unable to perform ROS: Mental acuity  Constitutional: Negative.   HENT: Negative.   Eyes: Negative.   Respiratory: Negative.   Gastrointestinal: Negative.   Genitourinary: Negative.   Musculoskeletal: Negative.   Skin: Negative.   Neurological: Negative.   Endo/Heme/Allergies: Negative.   Psychiatric/Behavioral: Positive for substance abuse ( Hx. THC use disorder). Negative for depression, hallucinations, memory loss and suicidal ideas. The patient  has insomnia. The patient is not nervous/anxious.     Blood pressure 112/77, pulse (!) 104, temperature 98.3 F (36.8 C), temperature source Oral, resp. rate 14, height 5\' 8"  (1.727 m), weight 65.3 kg (144 lb), SpO2 100 %.Body mass index is 21.9 kg/m.  General Appearance: Disheveled  Eye Contact:  Fair  Speech:  Clear and Coherent and Pressured  Volume:  Normal  Mood:  "great"  Affect:  Restricted  Thought Process:  Disorganized and Descriptions of Associations: Tangential  Orientation:  Full (Time, Place, and Person)  Thought Content:  Rumination Perceptions: denies AH/VH  Suicidal Thoughts:  No  Homicidal Thoughts:  No  Memory:  Difficult to assess due to tangential thought process  Judgement:  Impaired  Insight:  Lacking  Psychomotor Activity:  Normal  Concentration:  Concentration: Fair and Attention Span: Fair  Recall:  Difficult to assess due to tangential thought process  Fund of Knowledge:  Difficult to assess due to tangential thought process  Language:  Good  Akathisia:  Negative  Handed:  Right  AIMS (if indicated):     Assets:  Desire for Improvement Physical Health  ADL's:  Intact  Cognition:  WNL  Sleep:  Number of Hours: 3   # Bipolar I disorder Exam is notable for his tangential thought process, grandiosity and slightly pressured speech, although he is cooperative though the entire interview without behavioral issues. Although it is difficult to discern in the setting of marijuana use, will start Depakote for mood stabilization given severity of his symptoms. Will uptitrate risperidone for bipolar disorder. Will continue cogentin for EPS prevention. Will have  Risperidone prn for agitation. Will continue trazodone prn for insomnia.   Plan - Resume Depakote ER 1,000 mg qhs for mood stabilization.  - Resume Risperdal 3 mg qhs for mood control - Resume hydroxyzine 25 mg TID prn for anxiety - ResumeTrazodone 150 mg qhs for insomnia - Obtain labs: Depakote  level. - Admit for crisis management and stabilization. - Medication management to reduce current symptoms to base line and improve the patient's overall level of functioning. - Monitor for the adverse effect of the medications and anger outbursts - Continue 15 minutes observation for safety concerns - Encouraged to participate in milieu therapy and group therapy counseling sessions and also work with coping skills -  Develop treatment plan to decrease risk of relapse upon discharge and to reduce the need for readmission. -  Psycho-social education regarding relapse prevention and self care. - Health care follow up as needed for medical  problems. - Restart home medications where appropriate.  Observation Level/Precautions:  15 minute checks  Laboratory:  Per ED  Psychotherapy: Group sessions.  Medications: See MAR  Consultations:  As needed  Discharge Concerns:  Adherence to medication, mood stability.  Estimated LOS: 5-7 days  Other: Admit to the 500-Hall.   Physician Treatment Plan for Primary Diagnosis: Bipolar affective disorder, manic (HCC)  Long Term Goal(s): Improvement in symptoms so as ready for discharge  Short Term Goals: Ability to identify changes in lifestyle to reduce recurrence of condition will improve, Ability to verbalize feelings will improve and Ability to demonstrate self-control will improve  Physician Treatment Plan for Secondary Diagnosis: Principal Problem:   Bipolar affective disorder, manic (HCC) Active Problems:   Bipolar 1 disorder, manic, full remission (HCC)  Long Term Goal(s): Improvement in symptoms so as ready for discharge  Short Term Goals: Ability to identify and develop effective coping behaviors will improve, Compliance with prescribed medications will improve and Ability to identify triggers associated with substance abuse/mental health issues will improve  I certify that inpatient services furnished can reasonably be expected to improve the  patient's condition.    Sanjuana KavaNwoko, Kaulder Zahner I, NP, PMHNP, FNP-BC. 7/31/20183:58 PM

## 2016-08-09 NOTE — Progress Notes (Signed)
D    Pt is anxious with some pressured speech   He reports problems sleeping and requested to take his bedtime medications at midnight    He said he only sleeps until around 4 am and thought if he took them later he might sleep later   Pt behavior is appropriate A    Verbal support given   Medications administered and effectiveness monitored   Q 15 min checks R  Pt is safe at present time

## 2016-08-09 NOTE — Progress Notes (Signed)
The focus of this group is to help patients review their daily goal of treatment and discuss progress on daily workbooks. Pt attended the evening group session and responded to all discussion prompts from the Writer. Pt shared that today was a good day on the unit, the highlight of which was a visit from his girlfriend. Pt expressed disappointment that the hall did not go to the gym today, but reported that it was otherwise a good day.  Pt told the group that he was focused on having his blood drawn again to check his levels as he believes this will bring him closer to discharge, which he feels ready for.  Pt rated his day a 9 out of 10 and his affect was appropriate.

## 2016-08-09 NOTE — [facility] Group Notes (Signed)
[facility] LCSW Group Therapy  08/09/2016 , 2:18 PM   Type of Therapy:  Group Therapy  Participation Level:  Active  Participation Quality:  Attentive  Affect:  Appropriate  Cognitive:  Alert  Insight:  Improving  Engagement in Therapy:  Engaged  Modes of Intervention:  Discussion, Exploration and Socialization  Summary of Progress/Problems: Today's group focused on the term Diagnosis.  Participants were asked to define the term, and then pronounce whether it is a negative, positive or neutral term. Stayed the entire time.  Grandiose, tangential, pressured speech, repeating self.  Limited insight.  Appears stressed by the fact that his SO would like him to get a different job that does not involve as much travel since they are expecting a child.   Mitchell Geraldorth, Mitchell Mckee B 08/09/2016 , 2:18 PM

## 2016-08-09 NOTE — Progress Notes (Signed)
[facility] MD Progress Note  08/09/2016 11:18 AM Mitchell Mckee  MRN:  [id number]  Subjective: Mitchell Mckee reports, "It is going great today. I'm feel like I'm ready to go home. I met a good friend here. We live very close to each other. We will be playing basket ball together. 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 He learns from me & I will learn from him. I've got to get out of here, get back to work & plan for my new baby on the way".  Objective: Mitchell Mckee is seen & chart reviewed. He continues to exhibit symptoms of hypo-mania (pressured speech, racing thoughts), however, his thoughts & plans seem reasonable. He remains on the Depakote & Risperdal. He presents euphoric. He has no tremors shakes or any EPS. His Depakote level was 43. The Depakote dose was recently increased to a 1,000 mg Q hs. Continue Risperdal 3 mg at bedtime and Cogentin 1 mg at bedtime.  Encouraged to continue to participate in the group milieu therapy. Social worker continue disposition planning. Mitchell Mckee is visible on the unit, participating in the group milieu. He denies any new issues. Staff continues provided support. . Principal Problem: Bipolar affective disorder, manic ([place])  Diagnosis:   Patient Active Problem List   Diagnosis Date Noted  . Bipolar 1 disorder, manic, full remission ([place]) [F31.74] 08/05/2016  . Bipolar affective disorder, manic ([place]) [F31.10] 07/30/2016  . Cannabis use disorder, severe, dependence ([place]) [F12.20] 07/29/2016   Total Time spent with patient: 15 minutes  Past Psychiatric History: Bipolar disorder.  Past Medical History: History reviewed. No pertinent past medical history. History reviewed. No pertinent surgical history.  Family History: History reviewed. No pertinent family history.  Family Psychiatric  History: See H&P  Social History:  History  Alcohol Use No     History  Drug Use No    Social History   Social History  . Marital status: Single    Spouse name: N/A  . Number of children: N/A   . Years of education: N/A   Social History Main Topics  . Smoking status: Current Every Day Smoker  . Smokeless tobacco: Never Used  . Alcohol use No  . Drug use: No  . Sexual activity: Not Asked   Other Topics Concern  . None   Social History Narrative  . None   Additional Social History:    Pain Medications: see PTA meds Prescriptions: see PTA meds Over the Counter: see PTA meds History of alcohol / drug use?: Yes Name of Substance 1: Marijuana 1 - Frequency: daily 1 - Duration: ongoing 1 - Last Use / Amount: last night/a joint  Sleep: Fair  Appetite:  Fair  Current Medications: Current Facility-Administered Medications  Medication Dose Route Frequency Provider Last Rate Last Dose  . acetaminophen (TYLENOL) tablet 650 mg  650 mg Oral Q6H PRN Okonkwo, Justina A, NP      . alum & mag hydroxide-simeth (MAALOX/MYLANTA) 200-200-20 MG/5ML suspension 30 mL  30 mL Oral Q4H PRN Okonkwo, Justina A, NP      . benztropine (COGENTIN) tablet 1 mg  1 mg Oral QHS Okonkwo, Justina A, NP   1 mg at 08/08/16 2223  . divalproex (DEPAKOTE ER) 24 hr tablet 1,000 mg  1,000 mg Oral QHS Arfeen, Syed T, MD   1,000 mg at 08/08/16 2223  . hydrOXYzine (ATARAX/VISTARIL) tablet 25 mg  25 mg Oral TID PRN Hughie Closs A, NP   25 mg at 08/07/16 2204  . magnesium hydroxide (MILK OF  MAGNESIA) suspension 30 mL  30 mL Oral Daily PRN Okonkwo, Justina A, NP      . nicotine (NICODERM CQ - dosed in mg/24 hours) patch 21 mg  21 mg Transdermal Daily Okonkwo, Justina A, NP   21 mg at 08/09/16 0758  . risperiDONE (RISPERDAL) tablet 3 mg  3 mg Oral QHS Okonkwo, Justina A, NP   3 mg at 08/08/16 2223  . traZODone (DESYREL) tablet 150 mg  150 mg Oral QHS Okonkwo, Justina A, NP        Lab Results:  No results found for this or any previous visit (from the past 48 hour(s)).  Blood Alcohol level:  Lab Results  Component Value Date   ETH <5 41/66/0630   Metabolic Disorder Labs: Lab Results  Component Value  Date   HGBA1C 5.0 08/01/2016   MPG 97 08/01/2016   No results found for: PROLACTIN Lab Results  Component Value Date   CHOL 106 08/01/2016   TRIG 80 08/01/2016   HDL 37 (L) 08/01/2016   CHOLHDL 2.9 08/01/2016   VLDL 16 08/01/2016   LDLCALC 53 08/01/2016   Physical Findings: AIMS: Facial and Oral Movements Muscles of Facial Expression: None, normal Lips and Perioral Area: None, normal Jaw: None, normal Tongue: None, normal,Extremity Movements Upper (arms, wrists, hands, fingers): None, normal Lower (legs, knees, ankles, toes): None, normal, Trunk Movements Neck, shoulders, hips: None, normal, Overall Severity Severity of abnormal movements (highest score from questions above): None, normal Incapacitation due to abnormal movements: None, normal Patient's awareness of abnormal movements (rate only patient's report): No Awareness, Dental Status Current problems with teeth and/or dentures?: No Does patient usually wear dentures?: No  CIWA:    COWS:     Musculoskeletal: Strength & Muscle Tone: within normal limits Gait & Station: normal Patient leans: N/A  Psychiatric Specialty Exam: Physical Exam  Review of Systems  Constitutional: Negative.   HENT: Negative.   Eyes: Negative.   Respiratory: Negative.   Cardiovascular: Negative.   Genitourinary: Negative.   Musculoskeletal: Negative.   Skin: Negative.   Psychiatric/Behavioral: Positive for substance abuse (Hx. THC use disorder). Negative for depression, hallucinations, memory loss and suicidal ideas. The patient is nervous/anxious ("Improving") and has insomnia.     Blood pressure 112/77, pulse (!) 104, temperature 98.3 F (36.8 C), temperature source Oral, resp. rate 14, height '5\' 8"'$  (1.727 m), weight 65.3 kg (144 lb), SpO2 100 %.Body mass index is 21.9 kg/m.  General Appearance: Fairly Groomed  Eye Contact:  Good  Speech:  Fast & presured speech, however, coherent.  Volume:  Increased, but improving.  Mood:   Euphoric  Affect:  Labile  Thought Process:  Coherent and Descriptions of Associations: Circumstantial  Orientation:  Full (Time, Place, and Person)  Thought Content:  Rumination and flight of ideas  Suicidal Thoughts:  Denies any thoughts, plans or intent.  Homicidal Thoughts:  Denies any thoughts, plans or intent.  Memory:  Immediate;   Good Recent;   Good Remote;   Good  Judgement:  Fair  Insight:  Fair  Psychomotor Activity:  Increased  Concentration:  Concentration: Fair and Attention Span: Fair  Recall:  Good  Fund of Knowledge:  Good  Language:  Good  Akathisia:  No  Handed:  Right  AIMS (if indicated):     Assets:  Communication Skills Desire for Improvement Housing  ADL's:  Intact  Cognition:  WNL  Sleep:  Number of Hours: 5.25   Treatment Plan Summary: Patient is approaching his  baseline. No evidence of psychosis. Remains with evidence of hypo-mania (pressured speech). No dangerousness to self or others. We are continuing his care while social worker is working on finalizing aftercare    Psychiatric: Bipolar affective disorder, manic episodes.  Medical: Will continue monitor for any symptoms & treat on a prn basis.  Psychosocial:  Employed . Will encourage group counseling attendance & participation.  PLAN: 1.08-09-16: No changes made on the current plan of care, continue current regimen as recommended.  Anxiety: Continue Hydroxyzine 25 mg prn Q 6 hours.  EPS: Continue Benztropine 1 mg Q hs.  Mood control: Continue Risperdal 3 mg Q bedtime.  Mood Stabilization:  Continue Depakote ER 1,000 mg Q hs. Reviewed lab, Depakote level 08-07-16 46. Recheck Depakote level tomorrow 08-10-16.  Insomnia: Continue Trazodone 150 mg Q hs.  Nicotine Withdrawal symptoms: Will continue the nicotine patch 21 mg Q 24 hours..  2. Continue to monitor mood, behavior and interaction with peers  Encarnacion Slates, NP, PMHNP, FNP-BC 08/09/2016, 11:18 AMPatient ID: Mitchell Mckee, male   DOB: 03-05-1980, 36 y.o.

## 2016-08-09 NOTE — Progress Notes (Signed)
Recreation Therapy Notes  Date: 08/09/2016 Time: 10:00am Location: [address] Dayroom  Group Topic: Teamwork, Communication, Problem-Solving  Goal Area(s) Addresses:  Pt will be able to successfully identify the benefits of communication when working in teams. Pt will be able to successfully link this activity with how communication impacts their lives outside of the hospital.  Behavioral Response: Engaged  Intervention:  [employer] "Choices" Game  Activity: Pts will work together to reach a common goal by using communication. Pts will be split into smaller groups,A and B and will be asked to work together to trade places so team A switches spots with team B using specific rules. Once pts finished the first activity they played a game called "Choices" that is similar to "Would You Rather?" Pts will make a choice and explain why they made that decision.  Education:Teamwork, Communication, Problem-Solving , Discharge Planning  Education Outcome: Acknowledges education  Clinical Observations/Feedback: Pt participated in activities. Pt identified that by using communication his family would listen to him and it would benefit their relationship.  Marvell Fullerachel Meyer, Recreation Therapy Intern  Caroll RancherMarjette Carrera Kiesel, LRT/CTRS

## 2016-08-09 NOTE — Progress Notes (Signed)
D: Pt A & O to self, place and situation. Denies SI, HI, VH and pain. Presents with blunted affect affect, anxious mood and pressured speech. Per pt "I left and came right back Joselynn Amoroso, I went to [facility] and my medications were not right, my mind is all the place,so my wife brought me back, but I feel better in here though". Pt also stated "I still hear voices, sometimes I believe it's God".  Rates his depression, hopelessness and anxiety all 0/10. Reports he's sleeping and eating well. Pt's goal for today is "getting home". Minimal but appropriate interactions observed with peers and staff. A: nicotine patch administered as per order. Encouraged pt to voice concerns, attend groups as scheduled. Support and availability provided throughout this shift. Routine safety checks maintained and off unit without self harm gestures or outburst. R: Pt receptive to care. Denies concerns at this time. POC continues for safety and mood stability.

## 2016-08-10 LAB — VALPROIC ACID LEVEL: VALPROIC ACID LVL: 64 ug/mL (ref 50.0–100.0)

## 2016-08-10 NOTE — Progress Notes (Signed)
Recreation Therapy Notes   Date: 08/10/2016 Time: 10:00am Location: [address] Dayroom  Group Topic: Self-Esteem  Goal Area(s) Addresses:  Pt will be able to identify positive characteristics.  Behavioral Response: Engaged  Intervention: Game  Activity: Pts will throw back and forth a beach ball that has self-esteem questions, statements and open-ended sentences. When a pt gets the ball they have to answer the question, say the sentence or finish the sentence aloud.  Education: Self-Esteem, Discharge Planning  Education Outcome: Acknowledges understanding   Clinical Observations/Feedback: Pt actively participated in the activity. Pt identified that he is good at video games and he is a good dad. Pt stated he did not have a hard time identifying positive traits about himself.  Marvell Fullerachel Meyer, Recreation Therapy Intern  Caroll RancherMarjette Kastiel Simonian, LRT/CTRS

## 2016-08-10 NOTE — Progress Notes (Signed)
  D: When asked about his day pt stated, " I think I'm leaving tomorrow, I hope. Get home be with my pregnant gf." Pt stated, "i'm still depressed". However, pt denies SI, HI, and A/V. Stated, he was initially upset with the doctor because he's "been here waiting on blood work". Stated, he "believes his depakote is up to level". Pt stated, "I haven't been manic since Saturday", however pt was hyperverbal during the assessment. Pt has no other questions or concerns.    A:  Support and encouragement was offered. 15 min checks continued for safety.  R: Pt remains safe.

## 2016-08-10 NOTE — Progress Notes (Signed)
[facility]  08/10/2016 3:27 PM Mitchell MageRyan T Mckee  MRN:  [id number]  Subjective: Mitchell Mckee reports, "I"m doing well. I have been fine since Saturday, I believe. I just need to be discharged from here. I have got to get back to work or I'm going to  Be out of work soon. I'm afraid I'm going to lose my job. I now regret coming back to this hospital".  Objective: Mitchell Mckee is seen & chart reviewed. He presents more calm, settled & reasonable today. Denies racing thoughts,. He remains on the Depakote & Risperdal. He presents more worried about losing his job than anything else. He has no tremors shakes or any EPS. His Depakote level of 08-10-16 is 64. Continue Risperdal 3 mg at bedtime and Cogentin 1 mg at bedtime.  Encouraged to continue to participate in the group milieu therapy. Social worker continue disposition planning. Mitchell Mckee is visible on the unit, participating in the group milieu. He denies any new issues. Staff continues provided support. His case will be reviewed in the morning for discharge possibility. . Principal Problem: Bipolar affective disorder, manic ([facility])  Diagnosis:   Patient Active Problem List   Diagnosis Date Noted  . Bipolar 1 disorder, manic, full remission ([facility]) [F31.74] 08/05/2016  . Bipolar affective disorder, manic ([facility]) [F31.10] 07/30/2016  . Cannabis use disorder, severe, dependence ([facility]) [F12.20] 07/29/2016   Total Time spent with patient: 15 minutes  Past Psychiatric History: Bipolar disorder.  Past Medical History: History reviewed. No pertinent past medical history. History reviewed. No pertinent surgical history.  Family History: History reviewed. No pertinent family history.  Family Psychiatric  History: See H&P  Social History:  History  Alcohol Use No     History  Drug Use No    Social History   Social History  . Marital status: Single    Spouse name: N/A  . Number of children: N/A  . Years of education: N/A   Social History Main Topics  .  Smoking status: Current Every Day Smoker  . Smokeless tobacco: Never Used  . Alcohol use No  . Drug use: No  . Sexual activity: Not Asked   Other Topics Concern  . None   Social History Narrative  . None   Additional Social History:    Pain Medications: see PTA meds Prescriptions: see PTA meds Over the Counter: see PTA meds History of alcohol / drug use?: Yes Name of Substance 1: Marijuana 1 - Frequency: daily 1 - Duration: ongoing 1 - Last Use / Amount: last night/a joint  Sleep: Fair  Appetite:  Fair  Current Medications: Current Facility-Administered Medications  Medication Dose Route Frequency Provider Last Rate Last Dose  . acetaminophen (TYLENOL) tablet 650 mg  650 mg Oral Q6H PRN Okonkwo, Justina A, NP      . alum & mag hydroxide-simeth (MAALOX/MYLANTA) 200-200-20 MG/5ML suspension 30 mL  30 mL Oral Q4H PRN Okonkwo, Justina A, NP      . benztropine (COGENTIN) tablet 1 mg  1 mg Oral QHS Okonkwo, Justina A, NP   1 mg at 08/09/16 2330  . divalproex (DEPAKOTE ER) 24 hr tablet 1,000 mg  1,000 mg Oral QHS Arfeen, Syed T, MD   1,000 mg at 08/09/16 2329  . hydrOXYzine (ATARAX/VISTARIL) tablet 25 mg  25 mg Oral TID PRN Ferne Reuskonkwo, Justina A, NP   25 mg at 08/07/16 2204  . magnesium hydroxide (MILK OF MAGNESIA) suspension 30 mL  30 mL Oral Daily PRN Beryle Lathekonkwo, Justina A, NP      .  nicotine (NICODERM CQ - dosed in mg/24 hours) patch 21 mg  21 mg Transdermal Daily Okonkwo, Justina A, NP   21 mg at 08/10/16 0756  . risperiDONE (RISPERDAL) tablet 3 mg  3 mg Oral QHS Okonkwo, Justina A, NP   3 mg at 08/09/16 2330  . traZODone (DESYREL) tablet 150 mg  150 mg Oral QHS Okonkwo, Justina A, NP   Stopped at 08/09/16 2331   Lab Results:  Results for orders placed or performed during the hospital encounter of 08/05/16 (from the past 48 hour(s))  Valproic acid level     Status: None   Collection Time: 08/10/16  6:24 AM  Result Value Ref Range   Valproic Acid Lvl 64 50.0 - 100.0 ug/mL     Comment: Performed at [facility], [address], [place], [place] [address]    Blood Alcohol level:  Lab Results  Component Value Date   ETH <5 07/28/2016   Metabolic Disorder Labs: Lab Results  Component Value Date   HGBA1C 5.0 08/01/2016   MPG 97 08/01/2016   No results found for: PROLACTIN Lab Results  Component Value Date   CHOL 106 08/01/2016   TRIG 80 08/01/2016   HDL 37 (L) 08/01/2016   CHOLHDL 2.9 08/01/2016   VLDL 16 08/01/2016   LDLCALC 53 08/01/2016   Physical Findings: AIMS: Facial and Oral Movements Muscles of Facial Expression: None, normal Lips and Perioral Area: None, normal Jaw: None, normal Tongue: None, normal,Extremity Movements Upper (arms, wrists, hands, fingers): None, normal Lower (legs, knees, ankles, toes): None, normal, Trunk Movements Neck, shoulders, hips: None, normal, Overall Severity Severity of abnormal movements (highest score from questions above): None, normal Incapacitation due to abnormal movements: None, normal Patient's awareness of abnormal movements (rate only patient's report): No Awareness, Dental Status Current problems with teeth and/or dentures?: No Does patient usually wear dentures?: No  CIWA:    COWS:     Musculoskeletal: Strength & Muscle Tone: within normal limits Gait & Station: normal Patient leans: N/A  Psychiatric Specialty Exam: Physical Exam  Review of Systems  Constitutional: Negative.   HENT: Negative.   Eyes: Negative.   Respiratory: Negative.   Cardiovascular: Negative.   Genitourinary: Negative.   Musculoskeletal: Negative.   Skin: Negative.   Psychiatric/Behavioral: Positive for substance abuse (Hx. THC use disorder). Negative for depression, hallucinations, memory loss and suicidal ideas. The patient is nervous/anxious ("Improving") and has insomnia.     Blood pressure 104/76, pulse (!) 106, temperature 98.5 F (36.9 C), resp. rate 16, height 5\' 8"  (1.727 m), weight 65.3  kg (144 lb), SpO2 100 %.Body mass index is 21.9 kg/m.  General Appearance: Fairly Groomed  Eye Contact:  Good  Speech:  Clear and Coherent and less presured speech  Volume:  Normal, continues to improve.  Mood:  Improved, less euphoric, more woorried about getting back to work.  Affect:  Labile  Thought Process:  Coherent and Goal Directed  Orientation:  Full (Time, Place, and Person)  Thought Content:  Rumination  Suicidal Thoughts:  Denies any thoughts, plans or intent.  Homicidal Thoughts:  Denies any thoughts, plans or intent.  Memory:  Immediate;   Good Recent;   Good Remote;   Good  Judgement:  Fair  Insight:  Fair  Psychomotor Activity:  Normal  Concentration:  Concentration: Fair and Attention Span: Fair  Recall:  Good  Fund of Knowledge:  Good  Language:  Good  Akathisia:  No  Handed:  Right  AIMS (  if indicated):     Assets:  Communication Skills Desire for Improvement Housing  ADL's:  Intact  Cognition:  WNL  Sleep:  Number of Hours: 5.25   Treatment Plan Summary: Patient is approaching his baseline. No evidence of psychosis. Remains with evidence of hypo-mania (pressured speech). No dangerousness to self or others. We are continuing his care while social worker is working on finalizing discharge disposition.    Psychiatric: Bipolar affective disorder, manic episodes.  Medical: Will continue monitor for any symptoms & treat on a prn basis.  Psychosocial:  Employed . Will encourage group counseling attendance & participation.  PLAN: 1.08-10-16: No changes made on the current plan of care, continue current regimen as recommended.  Anxiety: Continue Hydroxyzine 25 mg prn Q 6 hours.  EPS: Continue Benztropine 1 mg Q hs.  Mood control: Continue Risperdal 3 mg Q bedtime.  Mood Stabilization:  Continue Depakote ER 1,000 mg Q hs. Reviewed lab, Depakote level 08-07-16 46. Reviewed Depakote level for 08-10-16, 64, within therapeutic  range.  Insomnia: Continue Trazodone 150 mg Q hs.  Nicotine Withdrawal symptoms: Will continue the nicotine patch 21 mg Q 24 hours..  2. Continue to monitor mood, behavior and interaction with peers  Mitchell Mckee, Agnes I, NP, PMHNP, FNP-BC 08/10/2016, 3:27 PMPatient ID: Mitchell Mageyan T Buxbaum, male   DOB: 11-19-1980, 36 y.o.    Agree with NP Progress Note

## 2016-08-10 NOTE — [facility] Group Notes (Signed)
[facility] Group Notes:  (Counselor/Nursing/MHT/Case Management/Adjunct)  08/10/2016 1:15PM  Type of Therapy:  Group Therapy  Participation Level:  Active  Participation Quality:  Appropriate  Affect:  Flat  Cognitive:  Oriented  Insight:  Improving  Engagement in Group:  Limited  Engagement in Therapy:  Limited  Modes of Intervention:  Discussion, Exploration and Socialization  Summary of Progress/Problems: The topic for group was balance in life.  Pt participated in the discussion about when their life was in balance and out of balance and how this feels.  Pt discussed ways to get back in balance and short term goals they can work on to get where they want to be.  "I know I am more balanced today because I am not talking as much.  I am really a pretty shy person.  I think it's possible to be too happy, because then you lose objectivity, and things come crashing in.  That's what happened to me."   Identified being with his girlfriend and friends as things that help him find balance. "I'm basically alone on my job during the week.  I feel isolated.  I need human contact."   Mitchell Mckee, Mitchell Mckee 08/10/2016 2:49 PM

## 2016-08-10 NOTE — Progress Notes (Signed)
Adult Psychoeducational Group Note  Date:  08/10/2016 Time:  9:31 PM  Group Topic/Focus:  Wrap-Up Group:   The focus of this group is to help patients review their daily goal of treatment and discuss progress on daily workbooks.  Participation Level:  Minimal  Participation Quality:  Drowsy  Affect:  Depressed  Cognitive:  Alert  Insight: Limited  Engagement in Group:  Lacking  Modes of Intervention:  Discussion  Additional Comments:  Patient stated he attended all the groups. He also stated he hasn't seen a Dr in "12 days".  Patty SermonsZachary J Gradyn Mckee 08/10/2016, 9:31 PM

## 2016-08-10 NOTE — [facility] Suicide Risk Assessment (Signed)
[facility] INPATIENT:  Family/Significant Other Suicide Prevention Education  Suicide Prevention Education:  Education Completed; No one has been identified by the patient as the family member/significant other with whom the patient will be residing, and identified as the person(s) who will aid the patient in the event of a mental health crisis (suicidal ideations/suicide attempt).  With written consent from the patient, the family member/significant other has been provided the following suicide prevention education, prior to the and/or following the discharge of the patient.  The suicide prevention education provided includes the following:  Suicide risk factors  Suicide prevention and interventions  National Suicide Hotline telephone number  [facility] assessment telephone number  [facility] Assistance 911  [facility] and/or [facility]idential Mobile Crisis Unit telephone number  Request made of family/significant other to:  Remove weapons (e.g., guns, rifles, knives), all items previously/currently identified as safety concern.    Remove drugs/medications (over-the-counter, prescriptions, illicit drugs), all items previously/currently identified as a safety concern.  The family member/significant other verbalizes understanding of the suicide prevention education information provided.  The family member/significant other agrees to remove the items of safety concern listed above. The patient did not endorse SI at the time of admission, nor did the patient c/o SI during the stay here.  SPE not required.  However, I did talk to  Parr[place],Lea Significant other [phone]  And went over crises planning and treatment team recommendations.  Ida RogueRodney B Ellarae Nevitt 08/10/2016, 4:05 PM

## 2016-08-10 NOTE — Plan of Care (Signed)
Problem: Activity: Goal: Sleeping patterns will improve Outcome: Not Progressing Pt only sleeps 2 to 3 hours per night  Problem: Health Behavior/Discharge Planning: Goal: Compliance with prescribed medication regimen will improve Outcome: Progressing Pt is compliant with medications

## 2016-08-10 NOTE — Progress Notes (Signed)
Patient ID: Mitchell MageRyan T Mckee, male   DOB: 1980/01/24, 36 y.o.   MRN: [id number] D:Affect is appropriate to mood. Anxious with multiple inquires re: discharge which has been his focus most of the day stating that he is ready to go rating depression and anxiety both as zero but does admit to poor sleep last night. A:Support and encouragement offered. R:Receptive. No complaints of pain or problems at this time.

## 2016-08-11 ENCOUNTER — Encounter (HOSPITAL_COMMUNITY): Payer: Self-pay | Admitting: Behavioral Health

## 2016-08-11 MED ORDER — DIVALPROEX SODIUM ER 500 MG PO TB24: 1000.0000 mg | ORAL_TABLET | Freq: Every day | ORAL | 0 refills | Status: DC

## 2016-08-11 MED ORDER — BENZTROPINE MESYLATE 1 MG PO TABS: 1.0000 mg | ORAL_TABLET | Freq: Every day | ORAL | 0 refills | Status: DC

## 2016-08-11 MED ORDER — NICOTINE 21 MG/24HR TD PT24: 21.0000 mg | MEDICATED_PATCH | Freq: Every day | TRANSDERMAL | 0 refills | Status: DC

## 2016-08-11 MED ORDER — HYDROXYZINE HCL 25 MG PO TABS: 25.0000 mg | ORAL_TABLET | Freq: Three times a day (TID) | ORAL | 0 refills | Status: DC | PRN

## 2016-08-11 MED ORDER — TRAZODONE HCL 150 MG PO TABS: 150.0000 mg | ORAL_TABLET | Freq: Every day | ORAL | 0 refills | Status: DC

## 2016-08-11 MED ORDER — RISPERIDONE 3 MG PO TABS: 3.0000 mg | ORAL_TABLET | Freq: Every day | ORAL | 0 refills | Status: DC

## 2016-08-11 NOTE — Plan of Care (Signed)
Problem: [facility] Participation in Recreation Therapeutic Interventions Goal: STG-Patient will attend/participate in Rec Therapy Group Ses STG-The Patient will attend and participate in Recreation Therapy Group Sessions  Outcome: Completed/Met Date Met: 08/11/16 Pt attended and actively participated in Recreation Therapy group sessions.  Donovan Kail, Recreation Therapy Intern

## 2016-08-11 NOTE — [facility] Suicide Risk Assessment (Signed)
[facility]   Principal Problem: Bipolar affective disorder, manic [facility](HCC) Discharge Diagnoses:  Patient Active Problem List   Diagnosis Date Noted  . Bipolar 1 disorder, manic, full remission (HCC) [F31.74] 08/05/2016  . Bipolar affective disorder, manic (HCC) [F31.10] 07/30/2016  . Cannabis use disorder, severe, dependence [facility](HCC) [F12.20] 07/29/2016   Patient is a 36 year old male readmitted [facility] by his girlfriend and girlfriend's mother reporting that patient is hypomanic again, is easily agitated, has racing thoughts, reports hearing voices from God.  Patient's Depakote was increased to help stabilize his mood. Patient is also on risperidone 3 mg at bedtime to help stabilize his mood. Patient was able to tolerate both the medications during the hospitalization, needed trazodone 150 mg at bedtime for sleep. Patient participated in the therapeutic milieu and on the day of discharge was calm, cooperative, reported no side effects with his medications a month denied any racing thoughts him a any decreased need for sleep, denied any hallucinations or paranoia. Patient also denied any thoughts of self-harm or harm to others  Total Time spent with patient: 30 minutes  Musculoskeletal: Strength & Muscle Tone: within normal limits Gait & Station: normal Patient leans: N/A  Psychiatric Specialty Exam: Review of Systems  Constitutional: Negative.  Negative for fever and malaise/fatigue.  HENT: Negative.  Negative for congestion and sore throat.   Eyes: Negative.  Negative for blurred vision, double vision, discharge and redness.  Respiratory: Negative.  Negative for cough, shortness of breath and wheezing.   Cardiovascular: Negative.  Negative for chest pain and palpitations.  Gastrointestinal: Negative.  Negative for abdominal pain, constipation, diarrhea, heartburn, nausea and vomiting.  Genitourinary: Negative.    Musculoskeletal: Negative.  Negative for falls, joint pain and myalgias.  Skin: Negative.  Negative for rash.  Neurological: Negative.  Negative for dizziness, focal weakness, seizures, loss of consciousness, weakness and headaches.  Endo/Heme/Allergies: Negative.  Negative for environmental allergies.  Psychiatric/Behavioral: Negative.  Negative for depression, hallucinations, memory loss, substance abuse and suicidal ideas. The patient is not nervous/anxious and does not have insomnia.     Blood pressure 92/62, pulse 99, temperature 97.9 F (36.6 C), resp. rate 16, height 5\' 8"  (1.727 m), weight 65.3 kg (144 lb), SpO2 100 %.Body mass index is 21.9 kg/m.  General Appearance: Casual  Eye Contact::  Fair  Speech:  Clear and Coherent and Normal Rate409  Volume:  Normal  Mood:  Euthymic  Affect:  Congruent and Full Range  Thought Process:  Coherent, Goal Directed and Descriptions of Associations: Intact  Orientation:  Full (Time, Place, and Person)  Thought Content:  WDL  Suicidal Thoughts:  No  Homicidal Thoughts:  No  Memory:  Immediate;   Fair Recent;   Fair Remote;   Fair  Judgement:  Intact  Insight:  Present and Shallow  Psychomotor Activity:  Normal  Concentration:  Fair  Recall:  Fis[employer]Fair  Fund of Knowledge:Fair  Language: Fair  Akathisia:  No  Handed:  Right  AIMS (if indicated):     Assets:  Desire for Improvement Financial Resources/Insurance Housing Physical Health Social Support Talents/Skills  Sleep:  Number of Hours: 3  Cognition: WNL  ADL's:  Intact   Mental Status Per Nursing Assessment::   On Admission:  NA  Demographic Factors:  Male and Caucasian  Loss Factors: NA  Historical Factors: Impulsivity  Risk Reduction Factors:   Sense of responsibility to family, Living with another person, especially a relative  and Positive social support  Continued Clinical Symptoms:  Previous Psychiatric Diagnoses and Treatments  Cognitive Features That  Contribute To Risk:  None    Suicide Risk:  Minimal: No identifiable suicidal ideation.  Patients presenting with no risk factors but with morbid ruminations; may be classified as minimal risk based on the severity of the depressive symptoms  Follow-up Information    [facility] on 08/16/2016.   Why:  Tuesday at 11:00 with Sheria Langameron, therapist.  Call them after you d/c to confirm you can make that appointment, give them an email address and pay a $20.00 deposit. Your psychiatrist appointment is Wed., September 12 at 11:00.  Contact information: [address] [place] [address] [phone]           Plan Of Care/Follow-up recommendations:  Activity:  As tolerated Diet:  Regular Other:  Keep follow-up appointments and take medications as prescribed  Nelly RoutKUMAR,Mallary Kreger, MD 08/11/2016, 10:33 AM

## 2016-08-11 NOTE — Tx Team (Signed)
Interdisciplinary Treatment and Diagnostic Plan Update  08/11/2016 Time of Session: 8:30 AM Mitchell Mckee MRN: [id number]  Principal Diagnosis: Bipolar affective disorder, manic (HCC)  Secondary Diagnoses: Principal Problem:   Bipolar affective disorder, manic (HCC) Active Problems:   Bipolar 1 disorder, manic, full remission (HCC)   Current Medications:  Current Facility-Administered Medications  Medication Dose Route Frequency Provider Last Rate Last Dose  . acetaminophen (TYLENOL) tablet 650 mg  650 mg Oral Q6H PRN Okonkwo, Justina A, NP      . alum & mag hydroxide-simeth (MAALOX/MYLANTA) 200-200-20 MG/5ML suspension 30 mL  30 mL Oral Q4H PRN Okonkwo, Justina A, NP      . benztropine (COGENTIN) tablet 1 mg  1 mg Oral QHS Okonkwo, Justina A, NP   1 mg at 08/10/16 2304  . divalproex (DEPAKOTE ER) 24 hr tablet 1,000 mg  1,000 mg Oral QHS Arfeen, Phillips Grout, MD   1,000 mg at 08/10/16 2304  . hydrOXYzine (ATARAX/VISTARIL) tablet 25 mg  25 mg Oral TID PRN Beryle Lathe, Justina A, NP   25 mg at 08/11/16 0045  . magnesium hydroxide (MILK OF MAGNESIA) suspension 30 mL  30 mL Oral Daily PRN Okonkwo, Justina A, NP      . nicotine (NICODERM CQ - dosed in mg/24 hours) patch 21 mg  21 mg Transdermal Daily Okonkwo, Justina A, NP   21 mg at 08/11/16 4098  . risperiDONE (RISPERDAL) tablet 3 mg  3 mg Oral QHS Okonkwo, Justina A, NP   3 mg at 08/10/16 2304  . traZODone (DESYREL) tablet 150 mg  150 mg Oral QHS Okonkwo, Justina A, NP   Stopped at 08/09/16 2331   PTA Medications: Prescriptions Prior to Admission  Medication Sig Dispense Refill Last Dose  . benztropine (COGENTIN) 1 MG tablet Take 1 tablet (1 mg total) by mouth at bedtime. For prevention of tremors 30 tablet 0   . divalproex (DEPAKOTE ER) 250 MG 24 hr tablet Take 3 tablets (750 mg total) by mouth at bedtime. For mood stabilization 90 tablet 0   . hydrOXYzine (ATARAX/VISTARIL) 50 MG tablet Take 1 tablet (50 mg total) by mouth 3 (three) times  daily. For anxiety 60 tablet 0   . nicotine (NICODERM CQ - DOSED IN MG/24 HOURS) 21 mg/24hr patch Place 1 patch (21 mg total) onto the skin daily. For smoking cessation 28 patch 0   . risperiDONE (RISPERDAL) 3 MG tablet Take 1 tablet (3 mg total) by mouth at bedtime. For mood control 30 tablet 0   . traZODone (DESYREL) 150 MG tablet Take 1 tablet (150 mg total) by mouth at bedtime. For sleep 30 tablet 0     Patient Stressors: Marital or family conflict Medication change or noncompliance  Patient Strengths: Wellsite geologist fund of knowledge Physical Health  Treatment Modalities: Medication Management, Group therapy, Case management,  1 to 1 session with clinician, Psychoeducation, Recreational therapy.   Physician Treatment Plan for Primary Diagnosis: Bipolar affective disorder, manic (HCC) Long Term Goal(s): Improvement in symptoms so as ready for discharge Improvement in symptoms so as ready for discharge   Short Term Goals: Ability to identify changes in lifestyle to reduce recurrence of condition will improve Ability to verbalize feelings will improve Ability to demonstrate self-control will improve Ability to identify and develop effective coping behaviors will improve Compliance with prescribed medications will improve Ability to identify triggers associated with substance abuse/mental health issues will improve  Medication Management: Evaluate patient's response, side effects, and tolerance of medication  regimen.  Therapeutic Interventions: 1 to 1 sessions, Unit Group sessions and Medication administration.  Evaluation of Outcomes: Adequate for Discharge  Physician Treatment Plan for Secondary Diagnosis: Principal Problem:   Bipolar affective disorder, manic (HCC) Active Problems:   Bipolar 1 disorder, manic, full remission (HCC)  Long Term Goal(s): Improvement in symptoms so as ready for discharge Improvement in symptoms so as ready for discharge   Short  Term Goals: Ability to identify changes in lifestyle to reduce recurrence of condition will improve Ability to verbalize feelings will improve Ability to demonstrate self-control will improve Ability to identify and develop effective coping behaviors will improve Compliance with prescribed medications will improve Ability to identify triggers associated with substance abuse/mental health issues will improve     Medication Management: Evaluate patient's response, side effects, and tolerance of medication regimen.  Therapeutic Interventions: 1 to 1 sessions, Unit Group sessions and Medication administration.  Evaluation of Outcomes: Adequate for Discharge   RN Treatment Plan for Primary Diagnosis: Bipolar affective disorder, manic (HCC) Long Term Goal(s): Knowledge of disease and therapeutic regimen to maintain health will improve  Short Term Goals: Ability to verbalize frustration and anger appropriately will improve, Ability to participate in decision making will improve and Ability to identify and develop effective coping behaviors will improve  Medication Management: RN will administer medications as ordered by provider, will assess and evaluate patient's response and provide education to patient for prescribed medication. RN will report any adverse and/or side effects to prescribing provider.  Therapeutic Interventions: 1 on 1 counseling sessions, Psychoeducation, Medication administration, Evaluate responses to treatment, Monitor vital signs and CBGs as ordered, Perform/monitor CIWA, COWS, AIMS and Fall Risk screenings as ordered, Perform wound care treatments as ordered.  Evaluation of Outcomes: Adequate for Discharge   LCSW Treatment Plan for Primary Diagnosis: Bipolar affective disorder, manic (HCC) Long Term Goal(s): Safe transition to appropriate next level of care at discharge, Engage patient in therapeutic group addressing interpersonal concerns.  Short Term Goals: Engage  patient in aftercare planning with referrals and resources, Facilitate acceptance of mental health diagnosis and concerns, Identify triggers associated with mental health/substance abuse issues and Increase skills for wellness and recovery  Therapeutic Interventions: Assess for all discharge needs, 1 to 1 time with Social worker, Explore available resources and support systems, Assess for adequacy in community support network, Educate family and significant other(s) on suicide prevention, Complete Psychosocial Assessment, Interpersonal group therapy.  Evaluation of Outcomes: Adequate for Discharge   Progress in Treatment: Attending groups: Yes. Participating in groups: Yes. Taking medication as prescribed: Yes. Toleration medication: Yes. Family/Significant other contact made: Yes Patient understands diagnosis: Yes Discussing patient identified problems/goals with staff: Yes. Medical problems stabilized or resolved: Yes. Denies suicidal/homicidal ideation: Yes. and As evidenced by:  contracts for safety on unit, states admission was a "mistake" and "my family misunderstood me" Issues/concerns per patient self-inventory: No. Other: NA  New problem(s) identified:   New Short Term/Long Term Goal(s): identify aftercare provider, "I just want my freedom"," I want to get back to work so I dont lose my job:  Discharge Plan or Barriers: Return home, [facility]  Reason for Continuation of Hospitalization:   Estimated Length of Stay: D/C today  Attendees: Patient: 08/11/2016 8:25 AM  Physician: Alyse LowA Kumar MD 08/11/2016 8:25 AM  Nursing: Meriam SpragueBeverly RN 08/11/2016 8:25 AM  RN Care Manager: Sondra BargesJ Clark RN CM 08/11/2016 8:25 AM  Social Worker: Governor RooksA Cunningham LCSW 08/11/2016 8:25 AM  Recreational Therapist:  08/11/2016  8:25 AM  Other:  08/11/2016 8:25 AM  Other:  08/11/2016 8:25 AM  Other: 08/11/2016 8:25 AM    Scribe for Treatment Team: Ida Rogueodney B Jakelyn Squyres, LCSW 08/11/2016 8:25 AM

## 2016-08-11 NOTE — Progress Notes (Signed)
Recreation Therapy Notes  Date: 08/11/2016 Time: 10:00am Location: [address] Dayroom  Group Topic: Leisure Education  Goal Area(s) Addresses:  Pt will be able to identify leisure activities. Pt will be able to successfully identify the benefits of participating in leisure activities.  Behavioral Response: Engaged  Intervention: Art  Activity: Pts will work in teams and make a leisure Primary school teacherpublic service announcement using magazine pictures, tape and markers to identify the things they enjoy doing for leisure. Pts will identify what the benefits are from participating in leisure activities.  Education: Leisure Education, Building control surveyorDischarge Planning  Education Outcome: Acknowledges understanding  Clinical Observations/Feedback: Pt actively participated in the activity. Pt identified that shopping for new shoes and playing video games are leisure activities he enjoys. Pt identified that the benefits are being happy and having fun.   Marvell Fullerachel Meyer, Recreation Therapy Intern   Caroll RancherMarjette Maleke Feria, LRT/CTRS

## 2016-08-11 NOTE — Progress Notes (Signed)
Recreation Therapy Notes  INPATIENT RECREATION TR PLAN  Patient Details Name: Mitchell Mckee MRN: [id number] DOB: 1980-12-26 Today's Date: 08/11/2016  Rec Therapy Plan Is patient appropriate for Therapeutic Recreation?: Yes Treatment times per week: at least 3x Estimated Length of Stay: 5-7 days TR Treatment/Interventions: Group participation (Comment) (appropriate participation in recreation therapy groups)  Discharge Criteria Pt will be discharged from therapy if:: Discharged Treatment plan/goals/alternatives discussed and agreed upon by:: Patient/family  Discharge Summary Short term goals set: The Patient will attend and participate in Recreation Therapy Group Sessions  Short term goals met: Complete Progress toward goals comments: Groups attended Which groups?: Self-esteem, Coping skills, Leisure education, Communication, Other (Comment) (teamwork, problem-solving) Reason goals not met: n/a Therapeutic equipment acquired: n/a Reason patient discharged from therapy: Discharge from hospital Pt/family agrees with progress & goals achieved: Yes Date patient discharged from therapy: 08/11/16  Donovan Kail, Recreation Therapy Intern  Donovan Kail 08/11/2016, 9:00 AM    Victorino Sparrow, LRT/CTRS

## 2016-08-11 NOTE — Progress Notes (Signed)
Pt d/c from the hospital. All items returned. D/C instructions given and prescriptions given. Pt denies si and hi. 

## 2016-08-11 NOTE — Progress Notes (Signed)
  [facility] :  Will you be returning to the same living situation after discharge:  Yes,  home At discharge, do you have transportation home?: Yes,  SO Do you have the ability to pay for your medications: Yes,  insurance  Release of information consent forms completed and in the chart;  Patient's signature needed at discharge.  Patient to Follow up at: Follow-up Information    [facility] Follow up on 08/16/2016.   Why:  Tuesday at 11:00 with Sheria Langameron, therapist.  Call them after you d/c to confirm you can make that appointment, give them an email address and pay a $20.00 deposit. Your psychiatrist appointment is Wed., September 12 at 11:00.  Contact information: [address] [place] [address] [phone]           Next level of care provider [facility] Link:no  Safety Planning and Suicide Prevention discussed: Yes,  yes  Have you used any form of tobacco in the last 30 days? (Cigarettes, Smokeless Tobacco, Cigars, and/or Pipes): No  Has patient been referred to the Quitline?: N/A patient is not a smoker  Patient has been referred for addiction treatment: Pt. refused referral  Mitchell RogueRodney B Lamika Connolly, LCSW 08/11/2016, 8:27 AM

## 2016-08-11 NOTE — Discharge Summary (Signed)
Physician Discharge Summary Note  Patient:  Mitchell Mckee T Swopes is an 36 y.o., male MRN:  [id number] DOB:  1980/12/27 Patient phone:  [phone] (home)  Patient address:   [address] [place] [id number],  Total Time spent with patient: 30 minutes  Date of Admission:  08/05/2016 Date of Discharge: 08/11/2016  Reason for Admission:  Bipolar disorder with hyper manic behavior, racing thoughts  Principal Problem: Bipolar affective disorder, manic [facility](HCC) Discharge Diagnoses: Patient Active Problem List   Diagnosis Date Noted  . Bipolar 1 disorder, manic, full remission (HCC) [F31.74] 08/05/2016  . Bipolar affective disorder, manic (HCC) [F31.10] 07/30/2016  . Cannabis use disorder, severe, dependence [facility](HCC) [F12.20] 07/29/2016    Past Psychiatric History: He has seen psychiatrist before, unable to elaborate it, unknown admission  Past Medical History: History reviewed. No pertinent past medical history. History reviewed. No pertinent surgical history. Family History: History reviewed. No pertinent family history. Family Psychiatric  History: History reviewed. No pertinent family history. Social History:  History  Alcohol Use No     History  Drug Use No    Social History   Social History  . Marital status: Single    Spouse name: N/A  . Number of children: N/A  . Years of education: N/A   Social History Main Topics  . Smoking status: Current Every Day Smoker  . Smokeless tobacco: Never Used  . Alcohol use No  . Drug use: No  . Sexual activity: Not Asked   Other Topics Concern  . None   Social History Narrative  . None    Hospital Course:  Mitchell Mckee an 36 y.o.malewho [facility] by GF and GF's mother with complaints of hyper manic behavior. Patient actively exhibiting racing thoughts with psychomotor agitation. Patient denies SI/HI, endorsing hearing from God. [facility].   After the above admission  assessment and during this hospital course, patients presenting symptoms were identified.  Patient at times continued to endorse racing thoughts however those thoughts did improve. Patient was treated and discharged with the following medications;Depakote 1000 mg po daily at bedtime for mood stabilization,  Risperdal  3 mg at bedtime and Cogentin 1 mg at bedtime for EPS, Vistaril 25 mg po 3 times a day as needed for anxiety, Nicoderm patch 21 mg TD for smoking cessation, Trazodone 150 mg po qhs for insomnia.  Patient participated in unit milieu.    Prior to discharge, While on the unit, patient was able to verbalize learned coping skills for better management of mental health condition before his discharge home.  During the course of his hospitalization, improvement was monitored by observation and Julen's daily  report of symptom reduction, presentation of good affect,and overall improvement in mood & behavior. Patient tolerated his treatment regimen without any adverse effects reported.  Deidrick's case was presented during treatment team meeting this morning. The team members were all in agreement that Alycia RossettiRyan was both mentally & medically stable to be discharged to continue mental health care on an outpatient basis as noted below. He was provided with all the necessary information needed to make this appointment without problems.  Upon discharge,Charleton  denied any SI/HI, AVH, delusional thoughts, or paranoia. He denied racing thoughts. He was [facility] with prescriptions  [facility] to be [facility] to his pharmacy.  [facility] with all personal belongings in no apparent distress. Transportation per patients arrangement.  Physical Findings: AIMS: Facial and Oral Movements  Muscles of Facial Expression: None, normal Lips and Perioral Area: None, normal Jaw: None, normal Tongue: None, normal,Extremity Movements Upper (arms, wrists, hands, fingers): None, normal Lower (legs, knees, ankles,  toes): None, normal, Trunk Movements Neck, shoulders, hips: None, normal, Overall Severity Severity of abnormal movements (highest score from questions above): None, normal Incapacitation due to abnormal movements: None, normal Patient's awareness of abnormal movements (rate only patient's report): No Awareness, Dental Status Current problems with teeth and/or dentures?: No Does patient usually wear dentures?: No  CIWA:    COWS:     Musculoskeletal: Strength & Muscle Tone: within normal limits Gait & Station: normal Patient leans: N/A  Psychiatric Specialty Exam: SEE SRA BY MD Physical Exam  Nursing note and vitals reviewed. Constitutional: He is oriented to person, place, and time.  Neurological: He is alert and oriented to person, place, and time.    Review of Systems  Psychiatric/Behavioral: Negative for hallucinations, memory loss, substance abuse and suicidal ideas. Nervous/anxious: improved. Insomnia: improved    All other systems reviewed and are negative.   Blood pressure 104/76, pulse (!) 106, temperature 98.5 F (36.9 C), resp. rate 16, height 5\' 8"  (1.727 m), weight 144 lb (65.3 kg), SpO2 100 %.Body mass index is 21.9 kg/m.    Have you used any form of tobacco in the last 30 days? (Cigarettes, Smokeless Tobacco, Cigars, and/or Pipes): No  Has this patient used any form of tobacco in the last 30 days? (Cigarettes, Smokeless Tobacco, Cigars, and/or Pipes) Yes, A prescription for an FDA-approved tobacco cessation medication was provided at discharge.   Blood Alcohol level:  Lab Results  Component Value Date   ETH <5 07/28/2016    Metabolic Disorder Labs:  Lab Results  Component Value Date   HGBA1C 5.0 08/01/2016   MPG 97 08/01/2016   No results found for: PROLACTIN Lab Results  Component Value Date   CHOL 106 08/01/2016   TRIG 80 08/01/2016   HDL 37 (L) 08/01/2016   CHOLHDL 2.9 08/01/2016   VLDL 16 08/01/2016   LDLCALC 53 08/01/2016    See Psychiatric  Specialty Exam and Suicide Risk Assessment completed by Attending Physician prior to discharge.  Discharge destination:  Home  Is patient on multiple antipsychotic therapies at discharge:  No   Has Patient had three or more failed trials of antipsychotic monotherapy by history:  No  Recommended Plan for Multiple Antipsychotic Therapies: NA   Allergies as of 08/11/2016   No Known Allergies     Medication List    TAKE these medications     Indication  benztropine 1 MG tablet Commonly known as:  COGENTIN Take 1 tablet (1 mg total) by mouth at bedtime. What changed:  additional instructions  Indication:  Extrapyramidal Reaction caused by Medications   divalproex 500 MG 24 hr tablet Commonly known as:  DEPAKOTE ER Take 2 tablets (1,000 mg total) by mouth at bedtime. What changed:  medication strength  how much to take  additional instructions  Indication:  Mood stabilization   hydrOXYzine 25 MG tablet Commonly known as:  ATARAX/VISTARIL Take 1 tablet (25 mg total) by mouth 3 (three) times daily as needed for anxiety. What changed:  medication strength  how much to take  when to take this  reasons to take this  additional instructions  Indication:  Feeling Anxious   nicotine 21 mg/24hr patch Commonly known as:  NICODERM CQ - dosed in mg/24 hours Place 1 patch (21 mg total) onto the skin daily. What  changed:  additional instructions  Indication:  Nicotine Addiction   risperiDONE 3 MG tablet Commonly known as:  RISPERDAL Take 1 tablet (3 mg total) by mouth at bedtime. What changed:  additional instructions  Indication:  Mood control   traZODone 150 MG tablet Commonly known as:  DESYREL Take 1 tablet (150 mg total) by mouth at bedtime. What changed:  additional instructions  Indication:  Trouble Sleeping      Follow-up Information    [facility] Follow up on 08/16/2016.   Why:  Tuesday at 11:00 with Sheria Lang, therapist.  Call them after you d/c  to confirm you can make that appointment, give them an email address and pay a $20.00 deposit. Your psychiatrist appointment is Wed., September 12 at 11:00.  Contact information: [address] [place] [address] [phone]           Follow-up recommendations:  Follow up with your outpatient provided for any medical issues. Activity & diet as recommended by your primary care provider.  Comments:  Patient is instructed prior to discharge to: Take all medications as prescribed by his/her mental healthcare provider. Report any adverse effects and or reactions from the medicines to his/her outpatient provider promptly. Patient has been instructed & cautioned: To not engage in alcohol and or illegal drug use while on prescription medicines. In the event of worsening symptoms, patient is instructed to call the crisis hotline, 911 and or go to the nearest ED for appropriate evaluation and treatment of symptoms. To follow-up with his/her primary care provider for your other medical issues, concerns and or health care needs.  Signed: Denzil Magnuson, NP 08/11/2016, 8:56 AM

## 2016-08-14 ENCOUNTER — Ambulatory Visit (HOSPITAL_COMMUNITY)
Admission: AD | Admit: 2016-08-14 | Discharge: 2016-08-14 | Disposition: A | Discharge status: Home / Self Care | Payer: 59 | Source: Home / Self Care | Attending: Psychiatry | Admitting: Psychiatry

## 2016-08-14 ENCOUNTER — Encounter (HOSPITAL_COMMUNITY): Payer: Self-pay | Admitting: Nurse Practitioner

## 2016-08-14 ENCOUNTER — Emergency Department (HOSPITAL_COMMUNITY)
Admission: EM | Admit: 2016-08-14 | Discharge: 2016-08-15 | Disposition: A | Discharge status: Home / Self Care | Payer: 59 | Attending: Emergency Medicine | Admitting: Emergency Medicine

## 2016-08-14 DIAGNOSIS — F419 Anxiety disorder, unspecified: Secondary | ICD-10-CM | POA: Diagnosis not present

## 2016-08-14 DIAGNOSIS — F319 Bipolar disorder, unspecified: Secondary | ICD-10-CM | POA: Diagnosis present

## 2016-08-14 DIAGNOSIS — F329 Major depressive disorder, single episode, unspecified: Secondary | ICD-10-CM | POA: Insufficient documentation

## 2016-08-14 DIAGNOSIS — F1220 Cannabis dependence, uncomplicated: Secondary | ICD-10-CM | POA: Diagnosis not present

## 2016-08-14 DIAGNOSIS — F3110 Bipolar disorder, current episode manic without psychotic features, unspecified: Secondary | ICD-10-CM | POA: Diagnosis present

## 2016-08-14 DIAGNOSIS — Z79899 Other long term (current) drug therapy: Secondary | ICD-10-CM | POA: Diagnosis not present

## 2016-08-14 DIAGNOSIS — F1994 Other psychoactive substance use, unspecified with psychoactive substance-induced mood disorder: Secondary | ICD-10-CM

## 2016-08-14 DIAGNOSIS — Z046 Encounter for general psychiatric examination, requested by authority: Secondary | ICD-10-CM | POA: Diagnosis not present

## 2016-08-14 DIAGNOSIS — F17210 Nicotine dependence, cigarettes, uncomplicated: Secondary | ICD-10-CM | POA: Diagnosis not present

## 2016-08-14 DIAGNOSIS — Z008 Encounter for other general examination: Secondary | ICD-10-CM | POA: Diagnosis not present

## 2016-08-14 DIAGNOSIS — Z1389 Encounter for screening for other disorder: Secondary | ICD-10-CM | POA: Insufficient documentation

## 2016-08-14 DIAGNOSIS — F1290 Cannabis use, unspecified, uncomplicated: Secondary | ICD-10-CM | POA: Insufficient documentation

## 2016-08-14 DIAGNOSIS — Z720 Tobacco use: Secondary | ICD-10-CM

## 2016-08-14 DIAGNOSIS — F17200 Nicotine dependence, unspecified, uncomplicated: Secondary | ICD-10-CM | POA: Insufficient documentation

## 2016-08-14 DIAGNOSIS — F1910 Other psychoactive substance abuse, uncomplicated: Secondary | ICD-10-CM | POA: Diagnosis not present

## 2016-08-14 DIAGNOSIS — F3174 Bipolar disorder, in full remission, most recent episode manic: Secondary | ICD-10-CM | POA: Diagnosis not present

## 2016-08-14 LAB — COMPREHENSIVE METABOLIC PANEL
ALT: 16 U/L — ABNORMAL LOW (ref 17–63)
ANION GAP: 9 (ref 5–15)
AST: 16 U/L (ref 15–41)
Albumin: 4 g/dL (ref 3.5–5.0)
Alkaline Phosphatase: 52 U/L (ref 38–126)
BILIRUBIN TOTAL: 0.3 mg/dL (ref 0.3–1.2)
BUN: 14 mg/dL (ref 6–20)
CHLORIDE: 107 mmol/L (ref 101–111)
CO2: 29 mmol/L (ref 22–32)
Calcium: 9.2 mg/dL (ref 8.9–10.3)
Creatinine, Ser: 1.16 mg/dL (ref 0.61–1.24)
Glucose, Bld: 69 mg/dL (ref 65–99)
POTASSIUM: 3.8 mmol/L (ref 3.5–5.1)
Sodium: 145 mmol/L (ref 135–145)
TOTAL PROTEIN: 6.9 g/dL (ref 6.5–8.1)

## 2016-08-14 LAB — CBC WITH DIFFERENTIAL/PLATELET
Basophils Absolute: 0 10*3/uL (ref 0.0–0.1)
Basophils Relative: 0 %
EOS PCT: 5 %
Eosinophils Absolute: 0.4 10*3/uL (ref 0.0–0.7)
HEMATOCRIT: 42.9 % (ref 39.0–52.0)
Hemoglobin: 14.5 g/dL (ref 13.0–17.0)
LYMPHS PCT: 15 %
Lymphs Abs: 1.4 10*3/uL (ref 0.7–4.0)
MCH: 30.3 pg (ref 26.0–34.0)
MCHC: 33.8 g/dL (ref 30.0–36.0)
MCV: 89.6 fL (ref 78.0–100.0)
MONO ABS: 1.2 10*3/uL — AB (ref 0.1–1.0)
MONOS PCT: 13 %
NEUTROS ABS: 6.1 10*3/uL (ref 1.7–7.7)
Neutrophils Relative %: 67 %
PLATELETS: 233 10*3/uL (ref 150–400)
RBC: 4.79 MIL/uL (ref 4.22–5.81)
RDW: 13.5 % (ref 11.5–15.5)
WBC: 9.1 10*3/uL (ref 4.0–10.5)

## 2016-08-14 LAB — RAPID URINE DRUG SCREEN, HOSP PERFORMED
Amphetamines: NOT DETECTED
Barbiturates: NOT DETECTED
Benzodiazepines: NOT DETECTED
Cocaine: NOT DETECTED
OPIATES: NOT DETECTED
Tetrahydrocannabinol: POSITIVE — AB

## 2016-08-14 LAB — ETHANOL

## 2016-08-14 LAB — SALICYLATE LEVEL

## 2016-08-14 LAB — ACETAMINOPHEN LEVEL: Acetaminophen (Tylenol), Serum: <10 ug/mL — ABNORMAL LOW (ref 10–30)

## 2016-08-14 LAB — VALPROIC ACID LEVEL: Valproic Acid Lvl: 47 ug/mL — ABNORMAL LOW (ref 50.0–100.0)

## 2016-08-14 MED ORDER — ONDANSETRON HCL 4 MG PO TABS: 4.0000 mg | ORAL_TABLET | Freq: Three times a day (TID) | ORAL | Status: DC | PRN

## 2016-08-14 MED ORDER — RISPERIDONE 1 MG PO TABS
3.0000 mg | ORAL_TABLET | Freq: Every day | ORAL | Status: DC
  Administered 2016-08-14: 22:00:00 3 mg via ORAL
  Filled 2016-08-14: qty 1

## 2016-08-14 MED ORDER — ZIPRASIDONE MESYLATE 20 MG IM SOLR: 20.0000 mg | INTRAMUSCULAR | Status: DC | PRN

## 2016-08-14 MED ORDER — DIVALPROEX SODIUM ER 500 MG PO TB24
1000.0000 mg | ORAL_TABLET | Freq: Every day | ORAL | Status: DC
  Administered 2016-08-14: 1000 mg via ORAL
  Filled 2016-08-14: qty 2

## 2016-08-14 MED ORDER — NICOTINE 21 MG/24HR TD PT24: 21.0000 mg | MEDICATED_PATCH | Freq: Every day | TRANSDERMAL | Status: DC

## 2016-08-14 MED ORDER — ALUM & MAG HYDROXIDE-SIMETH 200-200-20 MG/5ML PO SUSP: 30.0000 mL | Freq: Four times a day (QID) | ORAL | Status: DC | PRN

## 2016-08-14 MED ORDER — ACETAMINOPHEN 325 MG PO TABS: 650.0000 mg | ORAL_TABLET | ORAL | Status: DC | PRN

## 2016-08-14 MED ORDER — NICOTINE 14 MG/24HR TD PT24
14.0000 mg | MEDICATED_PATCH | Freq: Every day | TRANSDERMAL | Status: DC
  Administered 2016-08-14 – 2016-08-15 (×2): 14 mg via TRANSDERMAL
  Filled 2016-08-14 (×2): qty 1

## 2016-08-14 MED ORDER — HYDROXYZINE HCL 25 MG PO TABS: 50.0000 mg | ORAL_TABLET | Freq: Three times a day (TID) | ORAL | Status: DC | PRN

## 2016-08-14 MED ORDER — ZOLPIDEM TARTRATE 5 MG PO TABS: 5.0000 mg | ORAL_TABLET | Freq: Every evening | ORAL | Status: DC | PRN

## 2016-08-14 MED ORDER — TRAZODONE HCL 50 MG PO TABS
150.0000 mg | ORAL_TABLET | Freq: Every day | ORAL | Status: DC
  Administered 2016-08-14: 22:00:00 150 mg via ORAL
  Filled 2016-08-14: qty 1

## 2016-08-14 MED ORDER — BENZTROPINE MESYLATE 1 MG PO TABS
1.0000 mg | ORAL_TABLET | Freq: Every day | ORAL | Status: DC
Start: 2016-08-14 — End: 2016-08-15
  Administered 2016-08-14: 1 mg via ORAL
  Filled 2016-08-14: qty 1

## 2016-08-14 MED ORDER — RISPERIDONE 1 MG PO TBDP
2.0000 mg | ORAL_TABLET | Freq: Three times a day (TID) | ORAL | Status: DC | PRN
  Filled 2016-08-14: qty 2

## 2016-08-14 MED ORDER — LORAZEPAM 1 MG PO TABS: 1.0000 mg | ORAL_TABLET | ORAL | Status: DC | PRN

## 2016-08-14 NOTE — ED Notes (Signed)
Pt attempted to provide urine specimen but was unable to at this time 

## 2016-08-14 NOTE — BH Assessment (Signed)
Tele Assessment Note   Mitchell Mckee is an 36 y.o. male was brought to [facility] by family who say that he continues to be manic after his DC on Friday. Pt's GF staes that he has made veiled threats to take her baby (she is [redacted] weeks pregnant) and take her house where they live. She also states that pt made a veiled threat that the "Police might be called for domestic violence". HE is frustrated that she and his parents want him to get help. She states that pt has delusional thoughts that the staff at [facility] want him to work here even though he has no training, and that he is "already in", and that the Depakote he was given was actually marijuana that [facility] is testing for legalization. Family states that he has only slept 2 hours in 3 days and has been calling old friends from 10 years ago and asked a friend he met at [facility] who is homeless to stay at the home. They states that pt is usually quiet, calm and very soft spoken, does not talk much, ad he has been talking non-stop for 3 hrs 45 minutes before he came to the hospital. They say that at the ultrasound for the baby, pt talked about himself and his bipolar the entire time.  GF is going to magistrate to fill out IVC paperwork at this time.  Pt states that he is using the coping skills he learned at [facility] , and that he is trying to just "live his life, my family are the problem". He denies SI, HI, AVH, and admits to using marijuana 1x and drinking a little bit since DC.  MSE: Pt is casually dressed, alert, oriented x4 with pressured speech and restless motor behavior, pacing around the lobby. Eye contact is good. Pt's mood is anxious and affect is congruent with mood. Thought process is tangential. There is no indication Pt is currently responding to internal stimuli or experiencing delusional thought content. Pt was cooperative throughout assessment.   Margarita Grizzle, NP recommends IP treatment. Pt  agrees to go to [facility] for medical clearance /evaluate Depakote level.       Diagnosis: Bipolar, Manic  Past Medical History: No past medical history on file.  No past surgical history on file.  Family History: No family history on file.  Social History:  reports that he has been smoking.  He has never used smokeless tobacco. He reports that he does not drink alcohol or use drugs.  Additional Social History:  Alcohol / Drug Use Pain Medications: denies Prescriptions: denies Over the Counter: denies History of alcohol / drug use?: Yes Longest period of sobriety (when/how long):  (unknown) Substance #1 Name of Substance 1: Marijuana 1 - Age of First Use: unk 1 - Amount (size/oz): unk 1 - Frequency: daily 1 - Duration: ongoing 1 - Last Use / Amount: yesterday  CIWA: CIWA-Ar BP: (!) 131/111 Pulse Rate: (!) 112 COWS:    PATIENT STRENGTHS: (choose at least two) Ability for insight Active sense of humor Average or above average intelligence Capable of independent living Communication skills General fund of knowledge Motivation for treatment/growth Physical Health Supportive family/friends Work skills  Allergies: No Known Allergies  Home Medications:  (Not in a hospital admission)  OB/GYN Status:  No LMP for male patient.  General Assessment Data Location of Assessment: [facility] TTS Assessment: In system Is this a Tele or Face-to-Face Assessment?: Face-to-Face Is this an Initial Assessment or a Re-assessment for this encounter?:  Initial Assessment Marital status: Long term relationship Living Arrangements: Spouse/significant other Can pt return to current living arrangement?: Yes Admission Status: Voluntary (GF taking out papers) Is patient capable of signing voluntary admission?: Yes Referral Source: Self/Family/Friend Insurance type: [facility]  Medical Screening Exam [facility] Center Walk-in ONLY) Medical Exam completed: Yes  Crisis Care Plan Living Arrangements: Spouse/significant other Name of Psychiatrist: none Name of  Therapist: none  Education Status Is patient currently in school?: No  Risk to self with the past 6 months Suicidal Ideation: No Has patient been a risk to self within the past 6 months prior to admission? : No Suicidal Intent: No Has patient had any suicidal intent within the past 6 months prior to admission? : No Is patient at risk for suicide?: No Suicidal Plan?: No Has patient had any suicidal plan within the past 6 months prior to admission? : No Access to Means: No What has been your use of drugs/alcohol within the last 12 months?: see SA section Previous Attempts/Gestures: Yes How many times?: 0 Other Self Harm Risks:  (mental status) Intentional Self Injurious Behavior: None Family Suicide History: Unknown Recent stressful life event(s): Conflict (Comment) (with family) Persecutory voices/beliefs?: Yes Depression: Yes Depression Symptoms: Insomnia, Feeling angry/irritable Substance abuse history and/or treatment for substance abuse?: Yes Suicide prevention information given to non-admitted patients: Not applicable  Risk to Others within the past 6 months Homicidal Ideation: No Does patient have any lifetime risk of violence toward others beyond the six months prior to admission? : No Thoughts of Harm to Others: Yes-Currently Present (threats towards GF per GF) Current Homicidal Intent: No Current Homicidal Plan: No Access to Homicidal Means: No History of harm to others?: No Assessment of Violence: None Noted Does patient have access to weapons?: No Criminal Charges Pending?: No Does patient have a court date: No Is patient on probation?: No  Psychosis Hallucinations: None noted Delusions: Grandiose  Mental Status Report Appearance/Hygiene: Unremarkable Eye Contact: Good Motor Activity: Agitation, Restlessness Speech: Logical/coherent Level of Consciousness: Alert Mood: Anxious, Irritable, Pleasant Affect: Irritable, Anxious Anxiety Level:  Moderate Thought Processes: Tangential Judgement: Partial Orientation: Person, Place, Time, Situation Obsessive Compulsive Thoughts/Behaviors: Minimal  Cognitive Functioning Concentration: Good Memory: Recent Intact, Remote Intact IQ: Average Insight: Poor Impulse Control: Fair Appetite: Fair Weight Loss: 0 Weight Gain: 0 Sleep: Decreased Total Hours of Sleep:  (2 in 3 days per family) Vegetative Symptoms: None  ADLScreening [facility] Assessment Services) Patient's cognitive ability adequate to safely complete daily activities?: Yes Patient able to express need for assistance with ADLs?: Yes Independently performs ADLs?: Yes (appropriate for developmental age)  Prior Inpatient Therapy Prior Inpatient Therapy: Yes Prior Therapy Dates: 07/2016 Prior Therapy Facilty/Provider(s): [facility] Reason for Treatment: bipolar  Prior Outpatient Therapy Prior Outpatient Therapy: No Prior Therapy Dates: NA Prior Therapy Facilty/Provider(s): NA Reason for Treatment: NA Does patient have an ACCT team?: No Does patient have Intensive In-House Services?  : No Does patient have [facility] services? : No Does patient have P4CC services?: No  ADL Screening (condition at time of admission) Patient's cognitive ability adequate to safely complete daily activities?: Yes Is the patient deaf or have difficulty hearing?: No Does the patient have difficulty seeing, even when wearing glasses/contacts?: No Does the patient have difficulty concentrating, remembering, or making decisions?: Yes Patient able to express need for assistance with ADLs?: Yes Does the patient have difficulty dressing or bathing?: No Independently performs ADLs?: Yes (appropriate for developmental age) Does the patient have difficulty walking or climbing stairs?: No Weakness  of Legs: None Weakness of Arms/Hands: None  Home Assistive Devices/Equipment Home Assistive Devices/Equipment: None    Abuse/Neglect Assessment (Assessment to  be complete while patient is alone) Physical Abuse: Denies Verbal Abuse: Denies Sexual Abuse: Denies Exploitation of patient/patient's resources: Denies Self-Neglect: Denies Values / Beliefs Cultural Requests During Hospitalization: None Spiritual Requests During Hospitalization: None   Advance Directives (For Healthcare) Does Patient Have a Medical Advance Directive?: No Would patient like information on creating a medical advance directive?: No - Patient declined    Additional Information 1:1 In Past 12 Months?: No CIRT Risk: No Elopement Risk: Yes Does patient have medical clearance?: No     Disposition:  Disposition Initial Assessment Completed for this Encounter: Yes Disposition of Patient: Inpatient treatment program Type of inpatient treatment program: Adult  [facility] 08/14/2016 5:50 PM

## 2016-08-14 NOTE — ED Notes (Signed)
Pt. In burgundy scrubs. Pt. Searched and wanded by security. Pt. Has 1 belongings bag and 1 black bag. Pt. Has 1 apple watch, 1 pr. Black/green shoes, 1 black hat, 1 black belt, 1 black short, and 1 gray tank top. Pt. Belongings locked up in Cabinet #[id number].

## 2016-08-14 NOTE — H&P (Signed)
Behavioral Health Medical Screening Exam  Mitchell Mckee is an 36 y.o. male.  Total Time spent with patient: 20 minutes  Psychiatric Specialty Exam: Physical Exam  Constitutional: He is oriented to person, place, and time. He appears well-developed and well-nourished.  HENT:  Head: Normocephalic.  Right Ear: External ear normal.  Left Ear: External ear normal.  Neck: Normal range of motion.  Cardiovascular: Normal rate, normal heart sounds and intact distal pulses.   Respiratory: Effort normal and breath sounds normal.  GI: Soft. Bowel sounds are normal.  Musculoskeletal: Normal range of motion.  Neurological: He is alert and oriented to person, place, and time.  Skin: Skin is warm and dry.  Psychiatric: His mood appears anxious. His affect is labile. His speech is rapid and/or pressured. He is agitated. Thought content is delusional. He expresses impulsivity.    Review of Systems  Psychiatric/Behavioral: Positive for depression (bipolar w/psychotic features, hypomanic) and substance abuse. Negative for hallucinations, memory loss and suicidal ideas. The patient is nervous/anxious. The patient does not have insomnia.     BP (!) 131/111 (BP Location: Left Arm)   Pulse (!) 112   Temp 99.2 F (37.3 C) (Oral)   Resp 18   SpO2 99%    General Appearance: Casual  Eye Contact:  Good  Speech:  Pressured  Volume:  Normal  Mood:  Anxious and Irritable  Affect:  Congruent and Labile, mania  Thought Process:  Disorganized  Orientation:  Full (Time, Place, and Person)  Thought Content:  Tangential  Suicidal Thoughts:  No  Homicidal Thoughts:  No  Memory:  Immediate;   Good Recent;   Fair Remote;   Fair  Judgement:  Impaired  Insight:  Lacking  Psychomotor Activity:  Increased  Concentration: Concentration: Fair and Attention Span: Fair  Recall:  Good  Fund of Knowledge:Good  Language: Good  Akathisia:  No  Handed:  Right  AIMS (if indicated):     Assets:  Tour managerCommunication  Skills Financial Resources/Insurance Housing Physical Health Resilience Social Support Vocational/Educational  Sleep:       Musculoskeletal: Strength & Muscle Tone: within normal limits Gait & Station: normal Patient leans: N/A  BP (!) 131/111 (BP Location: Left Arm)   Pulse (!) 112   Temp 99.2 F (37.3 C) (Oral)   Resp 18   SpO2 99%   Recommendations:  Based on my evaluation the patient appears to have an emergency medical condition for which I recommend the patient be transferred to the emergency department for further evaluation.  Laveda AbbeLaurie Britton Parks, NP 08/14/2016, 4:43 PM

## 2016-08-14 NOTE — ED Provider Notes (Signed)
[place] DEPT Provider Note   CSN: [id number] Arrival date & time: 08/14/16  1734     History   Chief Complaint Chief Complaint  Patient presents with  . Psychiatric Evaluation    HPI Mitchell Mckee is a 36 y.o. male with a PMHx of bipolar 1 disorder and cannabis use disorder, who presents to the ED from [facility] for medical clearance. Chart review reveals that he was seen at [facility] just PTA and evaluated, and was sent here for medical clearance. Per notes from TTS counselor Ellouise Newer: "Mitchell Mckee is an 36 y.o. male was brought [facility] by family who say that he continues to be manic after his DC on Friday. Pt's GF staes that he has made veiled threats to take her baby (she is [redacted] weeks pregnant) and take her house where they live. She also states that pt made a veiled threat that the "Police might be called for domestic violence". HE is frustrated that she and his parents want him to get help. She states that pt has delusional thoughts that the staff at [facility] want him to work here even though he has no training, and that he is "already in", and that the Depakote he was given was actually marijuana that [facility] is testing for legalization. Family states that he has only slept 2 hours in 3 days and has been calling old friends from 10 years ago and asked a friend he met at [facility] who is homeless to stay at the home. They states that pt is usually quiet, calm and very soft spoken, does not talk much, ad he has been talking non-stop for 3 hrs 45 minutes before he came to the hospital. They say that at the ultrasound for the baby, pt talked about himself and his bipolar the entire time. GF is going to magistrate to fill out IVC paperwork at this time.... Margarita Grizzle, NP recommends IP treatment. Pt  agrees to go to [facility] for medical clearance /evaluate Depakote level."  Further chart review reveals that he was seen on 07/28/16 in the ED for manic behavior, had inpatient psychiatric care until 08/03/16 when he was  discharged; he then was readmitted in psychiatric care on 08/05/16-08/11/16.   Patient arrives to the ED unaccompanied, and states that he is here because his family thinks he's crazy and he is here to prove them wrong. He states that he feels a little anxious but his Vistaril helps, which he took 20 minutes ago. Aside from that he states he feels great and hasn't felt this good in weeks. He states that he's "new to having bipolar" and his family can't accept that he's working through it, but he states he's much better now than he was during his last admissions. He denies SI, HI, or AVH. He admits that he uses "too much" marijuana, last use was 9 hours ago. He denies any other drug use. He also admits that he had 1/4 of a 12oz beer last night around 9PM, but that his girlfriend threw out all the alcohol in the house and he "doesn't drink". He is a cigarette smoker but is trying to quit. He states he's compliant with most of his medications, including vistaril which he took 8mns ago, and depakote, cogentin, and risperdal that he took at 2am. The only medication he hasn't been taking is trazodone because he doesn't like how it makes him feel, so he only takes it if he absolutely needs it to sleep, but he hasn't used  it since leaving the hospital during his last admission. He denies any medical complaints at this time, and is voluntarily cooperating with evaluation, however per notes from [facility], his girlfriend was getting IVC paperwork out.    The history is provided by the patient and medical records. No language interpreter was used.  Mental Health Problem  Presenting symptoms: agitation (per girlfriend, making threats) and delusional (per family)   Presenting symptoms: no hallucinations, no homicidal ideas and no suicidal thoughts   Patient accompanied by:  Family member Onset quality:  Unable to specify Timing:  Constant Progression:  Unable to specify Chronicity:  Chronic Context: drug abuse     Treatment compliance:  All of the time Time since last psychoactive medication taken:  20 minutes Relieved by:  Anti-anxiety medications Worsened by:  Family interactions Ineffective treatments:  None tried Associated symptoms: anxiety   Associated symptoms: no abdominal pain and no chest pain   Risk factors: hx of mental illness and recent psychiatric admission     No past medical history on file.  Patient Active Problem List   Diagnosis Date Noted  . Bipolar 1 disorder, manic, full remission ([place]) 08/05/2016  . Bipolar affective disorder, manic ([place]) 07/30/2016  . Cannabis use disorder, severe, dependence ([place]) 07/29/2016    No past surgical history on file.     Home Medications    Prior to Admission medications   Medication Sig Start Date End Date Taking? Authorizing Provider  benztropine (COGENTIN) 1 MG tablet Take 1 tablet (1 mg total) by mouth at bedtime. 08/11/16   Mordecai Maes, NP  divalproex (DEPAKOTE ER) 500 MG 24 hr tablet Take 2 tablets (1,000 mg total) by mouth at bedtime. 08/11/16   Mordecai Maes, NP  hydrOXYzine (ATARAX/VISTARIL) 25 MG tablet Take 1 tablet (25 mg total) by mouth 3 (three) times daily as needed for anxiety. 08/11/16   Mordecai Maes, NP  nicotine (NICODERM CQ - DOSED IN MG/24 HOURS) 21 mg/24hr patch Place 1 patch (21 mg total) onto the skin daily. 08/12/16   Mordecai Maes, NP  risperiDONE (RISPERDAL) 3 MG tablet Take 1 tablet (3 mg total) by mouth at bedtime. 08/11/16   Mordecai Maes, NP  traZODone (DESYREL) 150 MG tablet Take 1 tablet (150 mg total) by mouth at bedtime. 08/11/16   Mordecai Maes, NP    Family History No family history on file.  Social History Social History  Substance Use Topics  . Smoking status: Current Every Day Smoker  . Smokeless tobacco: Never Used  . Alcohol use No     Allergies   Patient has no known allergies.   Review of Systems Review of Systems  Constitutional: Negative for chills and fever.   Respiratory: Negative for shortness of breath.   Cardiovascular: Negative for chest pain.  Gastrointestinal: Negative for abdominal pain, constipation, diarrhea, nausea and vomiting.  Genitourinary: Negative for dysuria and hematuria.  Musculoskeletal: Negative for arthralgias and myalgias.  Skin: Negative for color change.  Allergic/Immunologic: Negative for immunocompromised state.  Neurological: Negative for weakness and numbness.  Psychiatric/Behavioral: Positive for agitation (per girlfriend, making threats). Negative for hallucinations, homicidal ideas and suicidal ideas. The patient is nervous/anxious.    All other systems reviewed and are negative for acute change except as noted in the HPI.    Physical Exam Updated Vital Signs BP 121/89 (BP Location: Left Arm)   Pulse (!) 110   Temp 98.6 F (37 C) (Oral)   Resp 18   SpO2 98%   Physical  Exam  Constitutional: He is oriented to person, place, and time. Vital signs are normal. He appears well-developed and well-nourished.  Non-toxic appearance. No distress.  Afebrile, nontoxic, NAD, slightly disheveled appearance  HENT:  Head: Normocephalic and atraumatic.  Mouth/Throat: Oropharynx is clear and moist and mucous membranes are normal.  Eyes: Conjunctivae and EOM are normal. Right eye exhibits no discharge. Left eye exhibits no discharge.  Neck: Normal range of motion. Neck supple.  Cardiovascular: Regular rhythm, normal heart sounds and intact distal pulses.  Tachycardia present.  Exam reveals no gallop and no friction rub.   No murmur heard. Mildly tachycardic  Pulmonary/Chest: Effort normal and breath sounds normal. No respiratory distress. He has no decreased breath sounds. He has no wheezes. He has no rhonchi. He has no rales.  Abdominal: Soft. Normal appearance and bowel sounds are normal. He exhibits no distension. There is no tenderness. There is no rigidity, no rebound, no guarding, no CVA tenderness, no tenderness at  McBurney's point and negative Murphy's sign.  Musculoskeletal: Normal range of motion.  Neurological: He is alert and oriented to person, place, and time. He has normal strength. No sensory deficit.  Skin: Skin is warm, dry and intact. No rash noted.  Psychiatric: His mood appears anxious. His speech is rapid and/or pressured and tangential. He is hyperactive. He is not actively hallucinating. He expresses no homicidal and no suicidal ideation. He expresses no suicidal plans and no homicidal plans.  Appears anxious, somewhat hyperactive, with flight of ideas and somewhat tangential at times. Slightly rapid and pressured speech, but still pleasant and cooperative. Denies SI, HI, or AVH, doesn't seem to be responding to internal stimuli.   Nursing note and vitals reviewed.    ED Treatments / Results  Labs (all labs ordered are listed, but only abnormal results are displayed) Labs Reviewed  CBC WITH DIFFERENTIAL/PLATELET - Abnormal; Notable for the following:       Result Value   Monocytes Absolute 1.2 (*)    All other components within normal limits  COMPREHENSIVE METABOLIC PANEL - Abnormal; Notable for the following:    ALT 16 (*)    All other components within normal limits  ACETAMINOPHEN LEVEL - Abnormal; Notable for the following:    Acetaminophen (Tylenol), Serum <10 (*)    All other components within normal limits  RAPID URINE DRUG SCREEN, HOSP PERFORMED - Abnormal; Notable for the following:    Tetrahydrocannabinol POSITIVE (*)    All other components within normal limits  VALPROIC ACID LEVEL - Abnormal; Notable for the following:    Valproic Acid Lvl 47 (*)    All other components within normal limits  ETHANOL  SALICYLATE LEVEL    EKG  EKG Interpretation None       Radiology No results found.  Procedures Procedures (including critical care time)  Medications Ordered in ED Medications  risperiDONE (RISPERDAL M-TABS) disintegrating tablet 2 mg (not administered)      And  LORazepam (ATIVAN) tablet 1 mg (not administered)    And  ziprasidone (GEODON) injection 20 mg (not administered)  acetaminophen (TYLENOL) tablet 650 mg (not administered)  zolpidem (AMBIEN) tablet 5 mg (not administered)  ondansetron (ZOFRAN) tablet 4 mg (not administered)  alum & mag hydroxide-simeth (MAALOX/MYLANTA) 200-200-20 MG/5ML suspension 30 mL (not administered)  nicotine (NICODERM CQ - dosed in mg/24 hours) patch 21 mg (not administered)  benztropine (COGENTIN) tablet 1 mg (not administered)  divalproex (DEPAKOTE ER) 24 hr tablet 1,000 mg (not administered)  hydrOXYzine (ATARAX/VISTARIL) tablet  50 mg (not administered)  risperiDONE (RISPERDAL) tablet 3 mg (not administered)  traZODone (DESYREL) tablet 150 mg (not administered)     Initial Impression / Assessment and Plan / ED Course  I have reviewed the triage vital signs and the nursing notes.  Pertinent labs & imaging results that were available during my care of the patient were reviewed by me and considered in my medical decision making (see chart for details).     36 y.o. male here from [facility] for med clearance, his family took him there because they're worried about his behaviors, and he's made some threats of violence towards his girlfriend; per their notes, girlfriend is taking out IVC paperwork. They have already assessed that he met IP criteria. Pt admits anxiety but denies SI/HI/AVH, however hard to say if he's being truthful since pt has flight of ideas and is tangential at times. Appears slightly manic, somewhat hyperactive. Admits to [facility] use 9hrs ago, and minimal EtOH use last night, +smoker; cessation of all of these were encouraged. Denies any medical complaints at this time. States he's compliant with his meds, and brings them all in with him. On exam, slightly disheveled, mildly tachycardic, hyperactive/anxious appearing, flight of ideas and tangential. Will get clearance labs including depakote level. Will  reassess shortly, and also try to confirm that IVC paperwork has been taken out.   7:46 PM EtOH level undetectable. CBC w/diff unremarkable. CMP WNL. Salicylate and acetaminophen levels WNL. Depakote level subtherapeutic at 46. UDS with +THC but otherwise unremarkable. Pt medically cleared at this time. Psych hold orders, agitation orders, and home med orders placed. Please see TTS notes for further documentation of care/dispo. Pt stable at time of med clearance.     Final Clinical Impressions(s) / ED Diagnoses   Final diagnoses:  Medical clearance for psychiatric admission  Marijuana use  Anxiety  Bipolar 1 disorder ([place])  Tobacco user  Involuntary commitment  Subtherapeutic On valproic acid therapy    New Prescriptions New Prescriptions   No medications on [address] [place], [place] 08/14/16 1947    Tegeler, Gwenyth Allegra, MD 08/15/16 [phone]

## 2016-08-14 NOTE — ED Notes (Signed)
Patient's belongings placed in cabinets in 9-12 section.

## 2016-08-14 NOTE — ED Triage Notes (Signed)
Pt presents voluntarily stating that "for the third time, his family is trying to prove he is mentally unwell." He denies being depressed, suicidal or homicidal but admits to using "an embarrassing amount of marijuana to be able to cope with his family." He states he wants to "prove he is fine." Somewhat overactive verbally with flight of ideas.

## 2016-08-14 NOTE — ED Notes (Signed)
[facility] Expected date:  Expected time:  Means of arrival:  Comments: Holiday representativeconstruction

## 2016-08-14 NOTE — ED Notes (Signed)
SBAR Report received from previous nurse. Pt received calm and visible on unit and received with IVC papers in hand. Pt denies current SI/ HI, A/V H, depression, anxiety, or pain at this time, and appears otherwise stable and free of distress. Speech less pressured than when pt was last here but still rapid and pt able to stay on topic without flight of ideas. Pt reminded of camera surveillance, q 15 min rounds, and rules of the milieu. Will continue to assess.

## 2016-08-15 DIAGNOSIS — F17210 Nicotine dependence, cigarettes, uncomplicated: Secondary | ICD-10-CM | POA: Diagnosis not present

## 2016-08-15 DIAGNOSIS — F1220 Cannabis dependence, uncomplicated: Secondary | ICD-10-CM

## 2016-08-15 DIAGNOSIS — F1910 Other psychoactive substance abuse, uncomplicated: Secondary | ICD-10-CM | POA: Diagnosis not present

## 2016-08-15 DIAGNOSIS — F419 Anxiety disorder, unspecified: Secondary | ICD-10-CM | POA: Diagnosis not present

## 2016-08-15 DIAGNOSIS — F3174 Bipolar disorder, in full remission, most recent episode manic: Secondary | ICD-10-CM | POA: Diagnosis not present

## 2016-08-15 DIAGNOSIS — F1994 Other psychoactive substance use, unspecified with psychoactive substance-induced mood disorder: Secondary | ICD-10-CM

## 2016-08-15 MED ORDER — RISPERIDONE 1 MG PO TBDP
2.0000 mg | ORAL_TABLET | Freq: Three times a day (TID) | ORAL | Status: DC | PRN
  Filled 2016-08-15: qty 2

## 2016-08-15 MED ORDER — ZIPRASIDONE MESYLATE 20 MG IM SOLR: 20.0000 mg | INTRAMUSCULAR | Status: DC | PRN

## 2016-08-15 MED ORDER — LORAZEPAM 1 MG PO TABS: 1.0000 mg | ORAL_TABLET | ORAL | Status: DC | PRN

## 2016-08-15 NOTE — Discharge Instructions (Signed)
For your behavioral health needs, you are advised to continue treatment with the [facility]: ° °     [facility] °     [address] °     [place], [place] [address] °     [phone]

## 2016-08-15 NOTE — BH Assessment (Signed)
[facility] Assessment Progress Note  Per Thedore MinsMojeed Akintayo, MD, this pt does not require psychiatric hospitalization at this time.  [facility] with [facility].  This has been included in pt's discharge instructions.  Pt's nurse, Lincoln MaxinOlivette, has been notified.  Mitchell Canninghomas Tahja Liao, MA Triage Specialist [phone]

## 2016-08-15 NOTE — Consult Note (Signed)
[facility] Consult   Reason for Consult:  Manic behavior Referring Physician:  EDP Patient Identification: MALYK GIROUARD MRN:  [id number] Principal Diagnosis: Bipolar affective disorder, manic ([place])  Diagnosis:   Patient Active Problem List   Diagnosis Date Noted  . Bipolar 1 disorder, manic, full remission ([place]) [F31.74] 08/05/2016  . Bipolar affective disorder, manic ([place]) [F31.10] 07/30/2016  . Cannabis use disorder, severe, dependence ([place]) [F12.20] 07/29/2016    Total Time spent with patient: 30 minutes  Subjective:   Mitchell Mckee is a 36 y.o. male patient admitted with mnaic behavior.  HPI:  Mitchell Mckee is an 36 y.o. male was brought to [facility] by family who say that he continues to be manic after his DC on Friday. Pt is calm and cooperative, alert & oriented x 4, and appropriate in his behavior. Pt has an appointment with [facility] at [place] and also has been attending outpatient sessions at [facility].  Pt denies suicidal/homicidal ideation, denies auditory/visual hallucinations and does not appear to be responding to internal stimuli.  Pt is stable and psychiatrically cleared for discharge.   Past Psychiatric History: Bipolar affective disorder, cannabis use disorder  Risk to Self: Is patient at risk for suicide?: No Risk to Others:   Prior Inpatient Therapy:   Prior Outpatient Therapy:    Past Medical History: History reviewed. No pertinent past medical history. History reviewed. No pertinent surgical history. Family History: History reviewed. No pertinent family history. Family Psychiatric  History: Unknown Social History:  History  Alcohol Use No     History  Drug Use No    Social History   Social History  . Marital status: Single    Spouse name: N/A  . Number of children: N/A  . Years of education: N/A   Social History Main Topics  . Smoking status: Current Every Day Smoker  . Smokeless tobacco: Never Used  .  Alcohol use No  . Drug use: No  . Sexual activity: Not Asked   Other Topics Concern  . None   Social History Narrative  . None   Additional Social History:    Allergies:  No Known Allergies  Labs:  Results for orders placed or performed during the hospital encounter of 08/14/16 (from the past 48 hour(s))  CBC w/diff     Status: Abnormal   Collection Time: 08/14/16  6:12 PM  Result Value Ref Range   WBC 9.1 4.0 - 10.5 K/uL   RBC 4.79 4.22 - 5.81 MIL/uL   Hemoglobin 14.5 13.0 - 17.0 g/dL   HCT 42.9 39.0 - 52.0 %   MCV 89.6 78.0 - 100.0 fL   MCH 30.3 26.0 - 34.0 pg   MCHC 33.8 30.0 - 36.0 g/dL   RDW 13.5 11.5 - 15.5 %   Platelets 233 150 - 400 K/uL   Neutrophils Relative % 67 %   Neutro Abs 6.1 1.7 - 7.7 K/uL   Lymphocytes Relative 15 %   Lymphs Abs 1.4 0.7 - 4.0 K/uL   Monocytes Relative 13 %   Monocytes Absolute 1.2 (H) 0.1 - 1.0 K/uL   Eosinophils Relative 5 %   Eosinophils Absolute 0.4 0.0 - 0.7 K/uL   Basophils Relative 0 %   Basophils Absolute 0.0 0.0 - 0.1 K/uL  Comprehensive metabolic panel     Status: Abnormal   Collection Time: 08/14/16  6:12 PM  Result Value Ref Range   Sodium 145 135 - 145 mmol/L  Potassium 3.8 3.5 - 5.1 mmol/L   Chloride 107 101 - 111 mmol/L   CO2 29 22 - 32 mmol/L   Glucose, Bld 69 65 - 99 mg/dL   BUN 14 6 - 20 mg/dL   Creatinine, Ser 1.16 0.61 - 1.24 mg/dL   Calcium 9.2 8.9 - 10.3 mg/dL   Total Protein 6.9 6.5 - 8.1 g/dL   Albumin 4.0 3.5 - 5.0 g/dL   AST 16 15 - 41 U/L   ALT 16 (L) 17 - 63 U/L   Alkaline Phosphatase 52 38 - 126 U/L   Total Bilirubin 0.3 0.3 - 1.2 mg/dL   GFR calc non Af Amer >60 >60 mL/min   GFR calc Af Amer >60 >60 mL/min    Comment: (NOTE) The eGFR has been calculated using the CKD EPI equation. This calculation has not been validated in all clinical situations. eGFR's persistently <60 mL/min signify possible Chronic Kidney Disease.    Anion gap 9 5 - 15  Ethanol     Status: None   Collection Time:  08/14/16  6:12 PM  Result Value Ref Range   Alcohol, Ethyl (B) <5 <5 mg/dL    Comment:        LOWEST DETECTABLE LIMIT FOR SERUM ALCOHOL IS 5 mg/dL FOR MEDICAL PURPOSES ONLY   Salicylate level     Status: None   Collection Time: 08/14/16  6:12 PM  Result Value Ref Range   Salicylate Lvl <6.1 2.8 - 30.0 mg/dL  Acetaminophen level     Status: Abnormal   Collection Time: 08/14/16  6:12 PM  Result Value Ref Range   Acetaminophen (Tylenol), Serum <10 (L) 10 - 30 ug/mL    Comment:        THERAPEUTIC CONCENTRATIONS VARY SIGNIFICANTLY. A RANGE OF 10-30 ug/mL MAY BE AN EFFECTIVE CONCENTRATION FOR MANY PATIENTS. HOWEVER, SOME ARE BEST TREATED AT CONCENTRATIONS OUTSIDE THIS RANGE. ACETAMINOPHEN CONCENTRATIONS >150 ug/mL AT 4 HOURS AFTER INGESTION AND >50 ug/mL AT 12 HOURS AFTER INGESTION ARE OFTEN ASSOCIATED WITH TOXIC REACTIONS.   Urine rapid drug screen (hosp performed)     Status: Abnormal   Collection Time: 08/14/16  6:12 PM  Result Value Ref Range   Opiates NONE DETECTED NONE DETECTED   Cocaine NONE DETECTED NONE DETECTED   Benzodiazepines NONE DETECTED NONE DETECTED   Amphetamines NONE DETECTED NONE DETECTED   Tetrahydrocannabinol POSITIVE (A) NONE DETECTED   Barbiturates NONE DETECTED NONE DETECTED    Comment:        DRUG SCREEN FOR MEDICAL PURPOSES ONLY.  IF CONFIRMATION IS NEEDED FOR ANY PURPOSE, NOTIFY LAB WITHIN 5 DAYS.        LOWEST DETECTABLE LIMITS FOR URINE DRUG SCREEN Drug Class       Cutoff (ng/mL) Amphetamine      1000 Barbiturate      200 Benzodiazepine   607 Tricyclics       371 Opiates          300 Cocaine          300 THC              50   Valproic acid level     Status: Abnormal   Collection Time: 08/14/16  6:12 PM  Result Value Ref Range   Valproic Acid Lvl 47 (L) 50.0 - 100.0 ug/mL    Current Facility-Administered Medications  Medication Dose Route Frequency Provider Last Rate Last Dose  . acetaminophen (TYLENOL) tablet 650 mg  650 mg Oral  Q4H  PRN Street, [place], [place]      . alum & mag hydroxide-simeth (MAALOX/MYLANTA) 200-200-20 MG/5ML suspension 30 mL  30 mL Oral Q6H PRN Street, [place], [place]      . benztropine (COGENTIN) tablet 1 mg  1 mg Oral QHS [address] [place], [place]   1 mg at 08/14/16 2227  . divalproex (DEPAKOTE ER) 24 hr tablet 1,000 mg  1,000 mg Oral QHS Street, [place], [place]   1,000 mg at 08/14/16 2223  . hydrOXYzine (ATARAX/VISTARIL) tablet 50 mg  50 mg Oral TID PRN Street, [place], PA-C      . LORazepam (ATIVAN) tablet 1 mg  1 mg Oral PRN [address], [place], [place]       And  . risperiDONE (RISPERDAL M-TABS) disintegrating tablet 2 mg  2 mg Oral Q8H PRN Street, [place], [place]       And  . ziprasidone (GEODON) injection 20 mg  20 mg Intramuscular PRN Street, [place], [employer]      . nicotine (NICODERM CQ - dosed in mg/24 hours) patch 14 mg  14 mg Transdermal Daily Patrecia Pour, NP   14 mg at 08/15/16 0942  . ondansetron (ZOFRAN) tablet 4 mg  4 mg Oral Q8H PRN Street, [place], [place]      . risperiDONE (RISPERDAL) tablet 3 mg  3 mg Oral QHS [address], [place], [place]   3 mg at 08/14/16 2224  . traZODone (DESYREL) tablet 150 mg  150 mg Oral QHS [address], [place], [place]   150 mg at 08/14/16 2223  . zolpidem (AMBIEN) tablet 5 mg  5 mg Oral QHS PRN [address], [place], [place]       Current Outpatient Prescriptions  Medication Sig Dispense Refill  . Acetaminophen-Caffeine (EXCEDRIN TENSION HEADACHE) 500-65 MG TABS Take 1 tablet by mouth daily as needed.    . benztropine (COGENTIN) 1 MG tablet Take 1 tablet (1 mg total) by mouth at bedtime. 30 tablet 0  . divalproex (DEPAKOTE ER) 500 MG 24 hr tablet Take 2 tablets (1,000 mg total) by mouth at bedtime. 60 tablet 0  . hydrOXYzine (ATARAX/VISTARIL) 50 MG tablet Take 50 mg by mouth 3 (three) times daily as needed for anxiety.  0  . nicotine (NICODERM CQ - DOSED IN MG/24 HOURS) 21 mg/24hr patch Place 1 patch (21 mg total) onto the skin daily. 28 patch 0  . risperiDONE (RISPERDAL) 3 MG  tablet Take 1 tablet (3 mg total) by mouth at bedtime. 30 tablet 0  . traZODone (DESYREL) 150 MG tablet Take 1 tablet (150 mg total) by mouth at bedtime. 30 tablet 0  . hydrOXYzine (ATARAX/VISTARIL) 25 MG tablet Take 1 tablet (25 mg total) by mouth 3 (three) times daily as needed for anxiety. (Patient not taking: Reported on 08/14/2016) 30 tablet 0    Musculoskeletal: Strength & Muscle Tone: within normal limits Gait & Station: normal Patient leans: N/A  Psychiatric Specialty Exam: Physical Exam  Review of Systems  Psychiatric/Behavioral: Positive for substance abuse. Negative for depression, hallucinations, memory loss and suicidal ideas. The patient is nervous/anxious. The patient does not have insomnia.     Blood pressure 95/60, pulse 69, temperature 98.9 F (37.2 C), temperature source Oral, resp. rate 16, SpO2 100 %.There is no height or weight on file to calculate BMI.  General Appearance: Casual  Eye Contact:  Good  Speech:  Clear and Coherent and Normal Rate  Volume:  Normal  Mood:  Anxious  Affect:  Congruent  Thought Process:  Coherent, Goal Directed and Linear  Orientation:  Full (Time, Place, and Person)  Thought Content:  Logical  Suicidal Thoughts:  No  Homicidal Thoughts:  No  Memory:  Immediate;   Good Recent;   Good Remote;   Fair  Judgement:  Fair  Insight:  Good  Psychomotor Activity:  Normal  Concentration:  Concentration: Good and Attention Span: Good  Recall:  Good  Fund of Knowledge:  Good  Language:  Good  Akathisia:  No  Handed:  Right  AIMS (if indicated):     Assets:  Communication Skills Desire for Improvement Financial Resources/Insurance Housing Leisure Time Social Support Transportation  ADL's:  Intact  Cognition:  WNL  Sleep:        Treatment Plan Summary: Plan Bipolar affective disorder Discharge Home Follow up with [facility] for therapy and medication management Follow up with PCP for any new or existing medical  concerns  Disposition: No evidence of imminent risk to self or others at present.   Patient does not meet criteria for psychiatric inpatient admission. Supportive therapy provided about ongoing stressors. Discussed crisis plan, support from social network, calling 911, coming to the Emergency Department, and calling Suicide Hotline.  Ethelene Hal, NP 08/15/2016 10:52 AM  Patient seen face-to-face for psychiatric evaluation, chart reviewed and case discussed with the physician extender and developed treatment plan. Reviewed the information documented and agree with the treatment plan. Corena Pilgrim, MD

## 2016-08-15 NOTE — BHH Suicide Risk Assessment (Signed)
Suicide Risk Assessment  Discharge Assessment   [facility]   Principal Problem: Bipolar affective disorder, manic [facility](HCC) Discharge Diagnoses:  Patient Active Problem List   Diagnosis Date Noted  . Bipolar affective disorder, manic (HCC) [F31.10] 07/30/2016    Priority: High  . Substance induced mood disorder (HCC) [F19.94] 08/15/2016  . Bipolar 1 disorder, manic, full remission ([facility]) [F31.74] 08/05/2016  . Cannabis use disorder, severe, dependence ([facility]) [F12.20] 07/29/2016    Total Time spent with patient: 30 minutes  Musculoskeletal: Strength & Muscle Tone: within normal limits Gait & Station: normal Patient leans: N/A  Psychiatric Specialty Exam: Physical Exam ROS Blood pressure 95/60, pulse 69, temperature 98.9 F (37.2 C), temperature source Oral, resp. rate 16, SpO2 100 %.There is no height or weight on file to calculate BMI. General Appearance: Casual Eye Contact:  Good Speech:  Clear and Coherent and Normal Rate Volume:  Normal Mood:  Anxious Affect:  Congruent Thought Process:  Coherent, Goal Directed and Linear Orientation:  Full (Time, Place, and Person) Thought Content:  Logical Suicidal Thoughts:  No Homicidal Thoughts:  No Memory:  Immediate;   Good Recent;   Good Remote;   Fair Judgement:  Fair Insight:  Good Psychomotor Activity:  Normal Concentration:  Concentration: Good and Attention Span: Good Recall:  Good Fund of Knowledge:  Good Language:  Good Akathisia:  No Handed:  Right AIMS (if indicated):    Assets:  Communication Skills Desire for Improvement Financial Resources/Insurance Housing Leisure Time Social Support Transportation ADL's:  Intact Cognition:  WNL  Mental Status Per Nursing Assessment::   On Admission:   Pt was manic and pacing  Demographic Factors:  Male, Caucasian, Low socioeconomic status and Unemployed  Loss Factors: Financial problems/change in socioeconomic status  Historical  Factors: Impulsivity  Risk Reduction Factors:   Responsible for children under 38 years of age, Sense of responsibility to family, Living with another person, especially a relative and Positive social support  Continued Clinical Symptoms:  Severe Anxiety and/or Agitation Bipolar Disorder:   Mixed State Alcohol/Substance Abuse/Dependencies More than one psychiatric diagnosis Previous Psychiatric Diagnoses and Treatments  Cognitive Features That Contribute To Risk:  Closed-mindedness    Suicide Risk:  Minimal: No identifiable suicidal ideation.  Patients presenting with no risk factors but with morbid ruminations; may be classified as minimal risk based on the severity of the depressive symptoms    Plan Of Care/Follow-up recommendations:  Activity:  as tolerated Diet:  Heart Healthy  Laveda AbbeLaurie Britton Masyn Fullam, NP 08/15/2016, 11:40 AM

## 2018-10-03 ENCOUNTER — Ambulatory Visit (HOSPITAL_COMMUNITY)
Admission: RE | Admit: 2018-10-03 | Discharge: 2018-10-03 | Disposition: A | Discharge status: Home / Self Care | Payer: 59 | Attending: Psychiatry | Admitting: Psychiatry

## 2018-10-03 DIAGNOSIS — Z720 Tobacco use: Secondary | ICD-10-CM | POA: Insufficient documentation

## 2018-10-03 DIAGNOSIS — F319 Bipolar disorder, unspecified: Secondary | ICD-10-CM | POA: Insufficient documentation

## 2018-10-04 NOTE — H&P (Signed)
Behavioral Health Medical Screening Exam  Mitchell Mckee is an 38 y.o. male presents to [facility] with girl friend.  Patient states he is here because he feels like hes on the verge of bipolar manic episode.  He denies SI, HI, AVH and has never had a suicide attempt. He states he has diagnosis of bipolar for which he has medication prescribed but admits he doesn't take his medication and has not taken his meds in 6 months.  He states that there are some stressors at home such as "work and life".  He is hyperverbal which according to him is an indication of an oncoming manic episode.    Pt stated that in the time he has waited to be seen, he was able to calm down.  Patient stated the talk with his girlfriend and his buddy was very helpful.  [facility] He has a support system and not thoughts of self harm or harming others.    Recommendation from writer is discharge with education and medication compliance.  Total Time spent with patient: 45 minutes  Psychiatric Specialty Exam: Physical Exam  Constitutional: He is oriented to person, place, and time. He appears well-developed.  HENT:  Head: Normocephalic.  Eyes: Pupils are equal, round, and reactive to light.  Neck: Normal range of motion.  Respiratory: Effort normal.  Musculoskeletal: Normal range of motion.  Neurological: He is alert and oriented to person, place, and time.  Skin: Skin is warm and dry.  Psychiatric: His speech is normal and behavior is normal. Judgment and thought content normal. His mood appears anxious. Cognition and memory are normal.    Review of Systems  Psychiatric/Behavioral: Negative for hallucinations, substance abuse and suicidal ideas. The patient does not have insomnia.   All other systems reviewed and are negative.   There were no vitals taken for this visit.There is no height or weight on file to calculate BMI.  General Appearance: Casual  Eye Contact:  Good   Speech:  Normal Rate  Volume:  Normal  Mood:  Anxious  Affect:  Congruent  Thought Process:  Coherent and Descriptions of Associations: Intact  Orientation:  Full (Time, Place, and Person)  Thought Content:  WDL  Suicidal Thoughts:  No  Homicidal Thoughts:  No  Memory:  Immediate;   Good  Judgement:  Good  Insight:  Good  Psychomotor Activity:  Normal  Concentration: Concentration: Good  Recall:  Good  Fund of Knowledge:Good  Language: Good  Akathisia:  NA  Handed:  Right  AIMS (if indicated):     Assets:  Communication Skills Desire for Improvement  Sleep:       Musculoskeletal: Strength & Muscle Tone: within normal limits Gait & Station: normal Patient leans: N/A  There were no vitals taken for this visit.  Recommendations:  Based on my evaluation the patient does not appear to have an emergency medical condition.  Disposition: No evidence of imminent risk to self or others at present.   Patient does not meet criteria for psychiatric inpatient admission.  Deloria Lair, NP 10/04/2018, 1:27 AM

## 2018-10-04 NOTE — BH Assessment (Signed)
Assessment Note  Mitchell Mckee is an 38 y.o. male presenting as a walk-in to [facility] due to increased anxiety. Patient has history of bipolar and reporting "I see the red flags that I am about to have a bipolar episode". When asked what the red flags were, patient stated, "not eating and sleeping". Patient reported sleeping 3-4 hours nightly and sleeping a lot. Patient denied SI, HI, psychosis and alcohol/drugs. Patient reported onset of 4-5 days ago with worsening anxiety. Patient reported he feels the red flags are coming where he may need to some mental health treatment. Patient admitted to not taking medications in the past 6 months even though he never ran out of medication. Patient reported stressors include home and work life. Patient was inpatient for mental health at [facility] on 08/06/18 due to bipolar and increased agitation. Patient denied ever having past suicidal thoughts, suicidal attempts and self-harming behaviors.   Patient reported in time he was waiting to be seen, he was able to calm down and feels better. Patient reporting he talked with his girlfriend and his buddy and that it was very helpful.  Patient reported living with girlfriend, together for 10 years. Patient is currently employed and is working a lot of hours. Patient reported . Patient has a therapist and a psychiatrist at the [facility]. He has a support system and no thoughts of self harm or harming others. He is hyperverbal which according to him is an indication of an oncoming manic episode. Patient was cooperative during assessment. Patient was accepting of outpatient therapy resources and agreed to go to [facility] as walk-in on tomorrow to initiate medication management.   Diagnosis: Major depressive disorder and Bipolar disorder  Past Medical History: No past medical history on file.  No past surgical history on file.  Family History: No family history on file.  Social History:  reports that he has been smoking. He  has never used smokeless tobacco. He reports that he does not drink alcohol or use drugs.  Additional Social History:  Alcohol / Drug Use Pain Medications: see MAR Prescriptions: see MAR Over the Counter: see MAR  CIWA:   COWS:    Allergies: No Known Allergies  Home Medications: (Not in a hospital admission)   OB/GYN Status:  No LMP for male patient.  General Assessment Data Location of Assessment: [facility] Assessment Services TTS Assessment: In system Is this a Tele or Face-to-Face Assessment?: Face-to-Face Is this an Initial Assessment or a Re-assessment for this encounter?: Initial Assessment Patient Accompanied by:: Other(girlfriend and friend) Language Other than English: No Living Arrangements: (with girlfriend) What gender do you identify as?: Male Marital status: Single Living Arrangements: Spouse/significant other, Children Can pt return to current living arrangement?: Yes Admission Status: Voluntary Is patient capable of signing voluntary admission?: Yes Referral Source: Self/Family/Friend     Crisis Care Plan Living Arrangements: Spouse/significant other, Children Legal Guardian: (self) Name of Psychiatrist: (none ) Name of Therapist: (none)  Education Status Is patient currently in school?: No Is the patient employed, unemployed or receiving disability?: Employed  Risk to self with the past 6 months Suicidal Ideation: No Has patient been a risk to self within the past 6 months prior to admission? : No Suicidal Intent: No Has patient had any suicidal intent within the past 6 months prior to admission? : No Is patient at risk for suicide?: No Suicidal Plan?: No Has patient had any suicidal plan within the past 6 months prior to admission? : No Access  to Means: No What has been your use of drugs/alcohol within the last 12 months?: (none reported) Previous Attempts/Gestures: No How many times?: (0) Other Self Harm Risks: (none reported) Triggers for Past  Attempts: (n/a) Intentional Self Injurious Behavior: None Family Suicide History: No Recent stressful life event(s): (relationship discord) Persecutory voices/beliefs?: No Depression: Yes Depression Symptoms: Tearfulness, Feeling angry/irritable, Isolating(increased anxiety) Substance abuse history and/or treatment for substance abuse?: No Suicide prevention information given to non-admitted patients: Not applicable  Risk to Others within the past 6 months Homicidal Ideation: No Does patient have any lifetime risk of violence toward others beyond the six months prior to admission? : No Thoughts of Harm to Others: No Current Homicidal Intent: No Current Homicidal Plan: No Access to Homicidal Means: No Identified Victim: (n/a) History of harm to others?: No Assessment of Violence: None Noted Violent Behavior Description: (none reported) Does patient have access to weapons?: No Criminal Charges Pending?: No Does patient have a court date: No Is patient on probation?: No  Psychosis Hallucinations: None noted Delusions: None noted  Mental Status Report Appearance/Hygiene: Unremarkable Eye Contact: Fair Motor Activity: Freedom of movement Speech: Pressured, Rapid, Logical/coherent(hyperverbal) Level of Consciousness: Alert Mood: Anxious Affect: Anxious, Appropriate to circumstance Anxiety Level: Moderate Thought Processes: Relevant Judgement: Unimpaired Orientation: Person, Time, Situation, Place Obsessive Compulsive Thoughts/Behaviors: None  Cognitive Functioning Concentration: Fair Memory: Recent Intact Is patient IDD: No Insight: Fair Impulse Control: Fair Appetite: Fair Have you had any weight changes? : No Change Sleep: Decreased Total Hours of Sleep: (3-4) Vegetative Symptoms: None  ADLScreening [facility] Assessment Services) Patient's cognitive ability adequate to safely complete daily activities?: Yes Patient able to express need for assistance with ADLs?:  Yes Independently performs ADLs?: Yes (appropriate for developmental age)  Prior Inpatient Therapy Prior Inpatient Therapy: Yes Prior Therapy Dates: (07/2016) Prior Therapy Facilty/Provider(s): [facility]) Reason for Treatment: (bipolar)  Prior Outpatient Therapy Prior Outpatient Therapy: Yes Prior Therapy Dates: (6 months ago) Prior Therapy Facilty/Provider(s): ([facility]) Reason for Treatment: (bipolar) Does patient have an ACCT team?: No Does patient have Intensive In-House Services?  : No Does patient have [facility] services? : No Does patient have P4CC services?: No  ADL Screening (condition at time of admission) Patient's cognitive ability adequate to safely complete daily activities?: Yes Patient able to express need for assistance with ADLs?: Yes Independently performs ADLs?: Yes (appropriate for developmental age)  Disposition:  Disposition Initial Assessment Completed for this Encounter: Yes  Sherron Flemings, NP, patient does not meet inpatient criteria. Patient given outpatient resources for follow up. Patient agreed to go to [facility] on tomorrow.   On Site Evaluation by:   Reviewed with Physician:    Burnetta Sabin 10/04/2018 12:18 AM

## 2018-10-05 ENCOUNTER — Encounter (HOSPITAL_COMMUNITY): Payer: Self-pay | Admitting: Emergency Medicine

## 2018-10-05 ENCOUNTER — Emergency Department (HOSPITAL_COMMUNITY)
Admission: EM | Admit: 2018-10-05 | Discharge: 2018-10-06 | Disposition: A | Discharge status: Home / Self Care | Payer: Medicaid Other | Attending: Emergency Medicine | Admitting: Emergency Medicine

## 2018-10-05 ENCOUNTER — Other Ambulatory Visit: Payer: Self-pay

## 2018-10-05 DIAGNOSIS — F3113 Bipolar disorder, current episode manic without psychotic features, severe: Secondary | ICD-10-CM | POA: Insufficient documentation

## 2018-10-05 DIAGNOSIS — F17210 Nicotine dependence, cigarettes, uncomplicated: Secondary | ICD-10-CM | POA: Insufficient documentation

## 2018-10-05 HISTORY — DX: Bipolar disorder, unspecified: F31.9

## 2018-10-05 LAB — COMPREHENSIVE METABOLIC PANEL
ALT: 16 U/L (ref 0–44)
AST: 18 U/L (ref 15–41)
Albumin: 4.8 g/dL (ref 3.5–5.0)
Alkaline Phosphatase: 50 U/L (ref 38–126)
Anion gap: 13 (ref 5–15)
BUN: 14 mg/dL (ref 6–20)
CO2: 22 mmol/L (ref 22–32)
Calcium: 9 mg/dL (ref 8.9–10.3)
Chloride: 106 mmol/L (ref 98–111)
Creatinine, Ser: 1.07 mg/dL (ref 0.61–1.24)
GFR calc Af Amer: >60 mL/min (ref >60)
GFR calc non Af Amer: >60 mL/min (ref >60)
Glucose, Bld: 123 mg/dL — ABNORMAL HIGH (ref 70–99)
Potassium: 3.5 mmol/L (ref 3.5–5.1)
Sodium: 141 mmol/L (ref 135–145)
Total Bilirubin: 1 mg/dL (ref 0.3–1.2)
Total Protein: 7.3 g/dL (ref 6.5–8.1)

## 2018-10-05 LAB — RAPID URINE DRUG SCREEN, HOSP PERFORMED
Amphetamines: NOT DETECTED
Barbiturates: NOT DETECTED
Benzodiazepines: NOT DETECTED
Cocaine: NOT DETECTED
Opiates: NOT DETECTED
Tetrahydrocannabinol: POSITIVE — AB

## 2018-10-05 LAB — ETHANOL: Alcohol, Ethyl (B): <10 mg/dL (ref <10)

## 2018-10-05 LAB — CBC
HCT: 49.6 % (ref 39.0–52.0)
Hemoglobin: 16.2 g/dL (ref 13.0–17.0)
MCH: 30.6 pg (ref 26.0–34.0)
MCHC: 32.7 g/dL (ref 30.0–36.0)
MCV: 93.6 fL (ref 80.0–100.0)
Platelets: 284 10*3/uL (ref 150–400)
RBC: 5.3 MIL/uL (ref 4.22–5.81)
RDW: 13.1 % (ref 11.5–15.5)
WBC: 11.8 10*3/uL — ABNORMAL HIGH (ref 4.0–10.5)
nRBC: 0 % (ref 0.0–0.2)

## 2018-10-05 LAB — ACETAMINOPHEN LEVEL: Acetaminophen (Tylenol), Serum: <10 ug/mL — ABNORMAL LOW (ref 10–30)

## 2018-10-05 LAB — SALICYLATE LEVEL: Salicylate Lvl: <7 mg/dL (ref 2.8–30.0)

## 2018-10-05 MED ORDER — NICOTINE 21 MG/24HR TD PT24
21.0000 mg | MEDICATED_PATCH | Freq: Once | TRANSDERMAL | Status: DC
  Administered 2018-10-06: 21 mg via TRANSDERMAL
  Filled 2018-10-05: qty 1

## 2018-10-05 MED ORDER — NICOTINE 21 MG/24HR TD PT24
21.0000 mg | MEDICATED_PATCH | Freq: Once | TRANSDERMAL | Status: DC
  Administered 2018-10-05: 21:00:00 21 mg via TRANSDERMAL
  Filled 2018-10-05: qty 1

## 2018-10-05 MED ORDER — RISPERIDONE 2 MG PO TABS
3.0000 mg | ORAL_TABLET | Freq: Every day | ORAL | Status: DC
  Filled 2018-10-05: qty 1

## 2018-10-05 MED ORDER — TRAZODONE HCL 50 MG PO TABS
50.0000 mg | ORAL_TABLET | Freq: Every day | ORAL | Status: DC
  Administered 2018-10-05: 50 mg via ORAL
  Filled 2018-10-05: qty 1

## 2018-10-05 MED ORDER — OLANZAPINE 10 MG PO TBDP
10.0000 mg | ORAL_TABLET | Freq: Every day | ORAL | Status: DC
  Administered 2018-10-05: 10 mg via ORAL
  Filled 2018-10-05: qty 1

## 2018-10-05 MED ORDER — BENZTROPINE MESYLATE 1 MG PO TABS
1.0000 mg | ORAL_TABLET | Freq: Every day | ORAL | Status: DC
  Filled 2018-10-05: qty 1

## 2018-10-05 NOTE — ED Notes (Signed)
Ford from [employer] and states that patients is threatening the girlfriend and their baby. Patient states he is not SI or HI.

## 2018-10-05 NOTE — ED Notes (Addendum)
Pt not answering from lobby 

## 2018-10-05 NOTE — ED Notes (Signed)
Requested items to take a shower. Pharmacy tech here to speak with him and he complimented her attractive appearance and asked for her number. Has had Zyprexa but it has not made him tired. Continues to be very talkative and loud but has remained cooperative.
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
## 2018-10-05 NOTE — ED Triage Notes (Signed)
Pt reports that he is bipolar and wants someone to sit with him while he plays his xbox. Father brought him in bc thinks he needs help for his bipolar. Denies SI or HI. Fluid build up in ears and would like them checked out.

## 2018-10-05 NOTE — ED Notes (Signed)
PT UPDATED ON CURRENT STATUS. UP TO WINDOW SPEAKING WITH OFF DUTY. ACTIVE WITH CARE.

## 2018-10-05 NOTE — ED Provider Notes (Signed)
[facility]-EMERGENCY DEPT Provider Note   CSN: [id number] Arrival date & time: 10/05/18  1625    History   Chief Complaint Chief Complaint  Patient presents with  . Otalgia  . Anxiety    HPI Mitchell Mckee is a 38 y.o. male.   The history is provided by the patient.  Otalgia Anxiety  He has a history of bipolar disorder and states that he has been having problems for about the last week.  He did not sleep for 5 days but states he did sleep last night.  He claims compliance with his medications but states that he had stopped taking valproic acid because of side effects.  He admits using marijuana but denies other drug use and denies ethanol use.  He denies hallucinations, homicidal ideation, suicidal ideation.  He is complaining of rash on his waist from contact with his belt, chronic flaking from the occipital region of his scalp, and a sensation that he has fluid in his right ear.  Past Medical History:  Diagnosis Date  . Bipolar 1 disorder [facility])     Patient Active Problem List   Diagnosis Date Noted  . Substance induced mood disorder (HCC) 08/15/2016  . Bipolar 1 disorder, manic, full remission (HCC) 08/05/2016  . Bipolar affective disorder, manic (HCC) 07/30/2016  . Cannabis use disorder, severe, dependence (HCC) 07/29/2016    History reviewed. No pertinent surgical history.      Home Medications    Prior to Admission medications   Medication Sig Start Date End Date Taking? Authorizing Provider  hydrOXYzine (ATARAX/VISTARIL) 50 MG tablet Take 50 mg by mouth 3 (three) times daily as needed for anxiety. 08/03/16  Yes [provider]  benztropine (COGENTIN) 1 MG tablet Take 1 tablet (1 mg total) by mouth at bedtime. Patient not taking: Reported on 10/05/2018 08/11/16   Denzil Magnuson, NP  divalproex (DEPAKOTE ER) 500 MG 24 hr tablet Take 2 tablets (1,000 mg total) by mouth at bedtime. Patient not taking: Reported on 10/05/2018 08/11/16    Denzil Magnuson, NP  hydrOXYzine (ATARAX/VISTARIL) 25 MG tablet Take 1 tablet (25 mg total) by mouth 3 (three) times daily as needed for anxiety. Patient not taking: Reported on 08/14/2016 08/11/16   Denzil Magnuson, NP  nicotine (NICODERM CQ - DOSED IN MG/24 HOURS) 21 mg/24hr patch Place 1 patch (21 mg total) onto the skin daily. Patient not taking: Reported on 10/05/2018 08/12/16   Denzil Magnuson, NP  risperiDONE (RISPERDAL) 3 MG tablet Take 1 tablet (3 mg total) by mouth at bedtime. Patient not taking: Reported on 10/05/2018 08/11/16   Denzil Magnuson, NP  traZODone (DESYREL) 150 MG tablet Take 1 tablet (150 mg total) by mouth at bedtime. Patient not taking: Reported on 10/05/2018 08/11/16   Denzil Magnuson, NP    Family History No family history on file.  Social History Social History   Tobacco Use  . Smoking status: Current Every Day Smoker    Types: Cigarettes  . Smokeless tobacco: Never Used  Substance Use Topics  . Alcohol use: No    Alcohol/week: 0.0 standard drinks  . Drug use: Yes    Types: Marijuana     Allergies   Patient has no known allergies.   Review of Systems Review of Systems  HENT: Positive for ear pain.   All other systems reviewed and are negative.    Physical Exam Updated Vital Signs BP 127/86 (BP Location: Right Arm)   Pulse 94   Temp 98.9 F (  37.2 C) (Oral)   Resp 18   SpO2 99%   Physical Exam Vitals signs and nursing note reviewed.    38 year old male, resting comfortably and in no acute distress. Vital signs are normal. Oxygen saturation is 99%, which is normal. Head is normocephalic and atraumatic. PERRLA, EOMI. Oropharynx is clear. Neck is nontender and supple without adenopathy or JVD. Back is nontender and there is no CVA tenderness. Lungs are clear without rales, wheezes, or rhonchi. Chest is nontender. Heart has regular rate and rhythm without murmur. Abdomen is soft, flat, nontender without masses or hepatosplenomegaly and  peristalsis is normoactive. Extremities have no cyanosis or edema, full range of motion is present. Skin is warm and dry.  Area of dry scalp with flaking of skin on the occiput.  Erythematous and thickened areas across the beltline consistent with contact dermatitis. Neurologic: Awake, alert, oriented.  Pressured speech.  Cranial nerves are intact, there are no motor or sensory deficits.  ED Treatments / Results  Labs (all labs ordered are listed, but only abnormal results are displayed) Labs Reviewed  COMPREHENSIVE METABOLIC PANEL - Abnormal; Notable for the following components:      Result Value   Glucose, Bld 123 (*)    All other components within normal limits  ACETAMINOPHEN LEVEL - Abnormal; Notable for the following components:   Acetaminophen (Tylenol), Serum <10 (*)    All other components within normal limits  CBC - Abnormal; Notable for the following components:   WBC 11.8 (*)    All other components within normal limits  RAPID URINE DRUG SCREEN, HOSP PERFORMED - Abnormal; Notable for the following components:   Tetrahydrocannabinol POSITIVE (*)    All other components within normal limits  ETHANOL  SALICYLATE LEVEL   Procedures Procedures   Medications Ordered in ED Medications  nicotine (NICODERM CQ - dosed in mg/24 hours) patch 21 mg (21 mg Transdermal Patch Applied 10/05/18 2054)  OLANZapine zydis (ZYPREXA) disintegrating tablet 10 mg (10 mg Oral Given 10/05/18 2157)  traZODone (DESYREL) tablet 50 mg (50 mg Oral Given 10/05/18 2250)     Initial Impression / Assessment and Plan / ED Course  I have reviewed the triage vital signs and the nursing notes.  Pertinent lab results that were available during my care of the patient were reviewed by me and considered in my medical decision making (see chart for details).  Bipolar disorder in manic phase.  Old records are reviewed, and he does have prior hospitalization for bipolar disorder, had been evaluated in the ED 2  days ago and not felt to meet inpatient criteria at that time.  Labs are unremarkable.  Drug screen positive for marijuana, consistent with his history.  TTS consultation is appreciated.  Patient will be held overnight for psychiatric evaluation in the morning.  Final Clinical Impressions(s) / ED Diagnoses   Final diagnoses:  Bipolar disorder, current episode manic without psychotic features, severe [facility])    ED Discharge Orders    None       Delora Fuel, MD 54/09/81 0140

## 2018-10-05 NOTE — ED Notes (Signed)
PT CURRENTLY USING EARPHONES AND TEXTING ON PHONE

## 2018-10-06 MED ORDER — LORAZEPAM 1 MG PO TABS
2.0000 mg | ORAL_TABLET | Freq: Once | ORAL | Status: AC
  Administered 2018-10-06: 2 mg via ORAL
  Filled 2018-10-06: qty 2

## 2018-10-06 MED ORDER — SODIUM CHLORIDE 0.9 % IV BOLUS: 500.0000 mL | Freq: Once | INTRAVENOUS | Status: DC

## 2018-10-06 NOTE — BH Assessment (Signed)
[facility] Assessment Progress Note This Probation officer spoke with patient's father Henrik Orihuela [phone] to gather collateral information in reference to patient care and patient possible being discharged this date. Patient's father reports that he is concerned over patient's current lifestyle choices and feels patient is making bad decisions although patient's father did not express any concerns in reference to patient's safety or him harming himself if discharged. Patient was able to contact for safety and will be discharged later this date.

## 2018-10-06 NOTE — Discharge Summary (Signed)
Physician Discharge Summary Note  Patient:  Mitchell Mckee is an 38 y.o., male MRN:  [id number] DOB:  Mar 13, 1980 Patient phone:  [phone] (home)  Patient address:   [address] [place] [address],  Total Time spent with patient: 30 minutes  Mitchell Mckee is a 38 y.o. male presents to [place] ED with his girlfriend. The patient was seen via Telepsych by this provider for a re-assessment on 10/06/2018. The patient states he is here because he feels like he is on the verge of a bipolar manic episode.   The patient is alert and oriented x4, anxious but cooperative, and mood-congruent with affect on evaluation. The patient does not appear to be responding to internal or external stimuli. Neither is the patient presenting with any delusional thinking. The patient denies auditory or visual hallucinations. The patient denies suicidal, homicidal, or self-harm ideations. The patient is not presenting with any psychotic or paranoid behaviors. During an encounter with the patient, he was able to answer questions appropriately.  TTS Counselor Mr. Umberger obtained collateral by speaking with patient's father Indy Kuck [phone] to gather collateral information in reference to patient care and patient possible being discharged this date. Patient's father reports that he is concerned over patient's current lifestyle choices and feels patient is making bad decisions. Patient's father did not express   Date of Admission:  10/05/2018 Date of Discharge: 10/06/2018  Reason for Admission:  Bipolar Manic Episode  Principal Problem: Aggression  Discharge Diagnoses: Bipolar 1 Disorder [facility])  Past Psychiatric History:   Bipolar 1 Disorder ([place]) Past Medical History:  Past Medical History:  Diagnosis Date  . Bipolar 1 disorder ([place])    History reviewed. No pertinent surgical history. Family History: No family history on file. Family Psychiatric  History: No history on file Social History:   Social History   Substance and Sexual Activity  Alcohol Use No  . Alcohol/week: 0.0 standard drinks     Social History   Substance and Sexual Activity  Drug Use Yes  . Types: Marijuana    Social History   Socioeconomic History  . Marital status: Single    Spouse name: Not on file  . Number of children: Not on file  . Years of education: Not on file  . Highest education level: Not on file  Occupational History  . Not on file  Social Needs  . Financial resource strain: Not on file  . Food insecurity    Worry: Not on file    Inability: Not on file  . Transportation needs    Medical: Not on file    Non-medical: Not on file  Tobacco Use  . Smoking status: Current Every Day Smoker    Types: Cigarettes  . Smokeless tobacco: Never Used  Substance and Sexual Activity  . Alcohol use: No    Alcohol/week: 0.0 standard drinks  . Drug use: Yes    Types: Marijuana  . Sexual activity: Not on file  Lifestyle  . Physical activity    Days per week: Not on file    Minutes per session: Not on file  . Stress: Not on file  Relationships  . Social Herbalist on phone: Not on file    Gets together: Not on file    Attends religious service: Not on file    Active member of club or organization: Not on file    Attends meetings of clubs or organizations: Not on file    Relationship status:  Not on file  Other Topics Concern  . Not on file  Social History Narrative  . Not on file    Hospital Course:  24 hours  Physical Findings: AIMS:  , ,  ,  ,    CIWA:    COWS:     Musculoskeletal: Strength & Muscle Tone: within normal limits Gait & Station: normal Patient leans: N/A  Psychiatric Specialty Exam: Physical Exam  Nursing note and vitals reviewed. Constitutional: He is oriented to person, place, and time. He appears well-developed and well-nourished.  Neck: Normal range of motion. Neck supple.  Cardiovascular: Normal rate.  Respiratory: Effort normal.   Musculoskeletal: Normal range of motion.  Neurological: He is alert and oriented to person, place, and time.    Review of Systems  Psychiatric/Behavioral: The patient is nervous/anxious.   All other systems reviewed and are negative.   Blood pressure 109/65, pulse 97, temperature 98.4 F (36.9 C), temperature source Oral, resp. rate 20, SpO2 95 %.There is no height or weight on file to calculate BMI.  General Appearance: Disheveled  Eye Contact:  Fair  Speech:  Clear and Coherent  Volume:  Normal  Mood:  Anxious and Irritable  Affect:  Congruent  Thought Process:  Coherent  Orientation:  Full (Time, Place, and Person)  Thought Content:  Logical  Suicidal Thoughts:  No  Homicidal Thoughts:  No  Memory:  Immediate;   Good Recent;   Good Remote;   Good  Judgement:  Intact  Insight:  Fair  Psychomotor Activity:  Normal  Concentration:  Concentration: Good and Attention Span: Good  Recall:  Good  Fund of Knowledge:  Good  Language:  Good  Akathisia:  Negative  Handed:  Right  AIMS (if indicated):     Assets:  Communication Skills Desire for Improvement Intimacy Social Support Vocational/Educational  ADL's:  Intact  Cognition:  WNL  Sleep:           Has this patient used any form of tobacco in the last 30 days? (Cigarettes, Smokeless Tobacco, Cigars, and/or Pipes) Yes, Yes, Prescription not provided because: Patient is not requesting any presecription.  Blood Alcohol level:  Lab Results  Component Value Date   ETH <10 10/05/2018   ETH <5 08/14/2016    Metabolic Disorder Labs:  Lab Results  Component Value Date   HGBA1C 5.0 08/01/2016   MPG 97 08/01/2016   No results found for: PROLACTIN Lab Results  Component Value Date   CHOL 106 08/01/2016   TRIG 80 08/01/2016   HDL 37 (L) 08/01/2016   CHOLHDL 2.9 08/01/2016   VLDL 16 08/01/2016   LDLCALC 53 08/01/2016    See Psychiatric Specialty Exam and Suicide Risk Assessment completed by Attending Physician  prior to discharge.  Discharge destination:  Home  Is patient on multiple antipsychotic therapies at discharge:  Yes,   Do you recommend tapering to monotherapy for antipsychotics?  No   Has Patient had three or more failed trials of antipsychotic monotherapy by history:  No  Discharge home. The patient appears reasonably screened and/or stabilized for discharge and does not appear to have emergency medical/psychiatric concerns/conditions requiring further screening, evaluation, or treatment at this time prior to discharge. Take all of you medications as prescribed by your mental healthcare provider. Report any adverse effects and reactions from your medications to your outpatient provider promptly. Do not engage in alcohol and or illegal drug use while on prescription medicines. Keep all scheduled appointments. This is to ensure that  you are getting refills on time and to avoid any interruption in your medication. If you are unable to keep an appointment call to reschedule. Be sure to follow up with resources and follow ups given. In the event of worsening symptoms call the crisis hotline, 911, and or go to the nearest emergency department for appropriate evaluation and treatment of symptoms. Follow-up with your primary care provider for your medical issues, concerns and or health care needs.    Follow-up recommendations:  Other:  information given by Social Worker  Comments:    Signed: Gillermo Murdoch, NP 10/06/2018, 3:39 PM

## 2018-10-06 NOTE — ED Notes (Signed)
Pt DCd off unit to home per patient. Pt alert, calm, cooperative, no s/s of distress. DC information given to pt. Belongings given to pt . Pt ambulatory off unit, escorted by RN. Pt transported by cab or uber per pt.

## 2018-10-06 NOTE — ED Notes (Signed)
Pt quiet, guarded.
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
## 2018-10-06 NOTE — ED Notes (Signed)
Pacing the unit. States he is going to lose it if he doesn't get out of here. States he doesn't understand why he is here. Writer has discussed reason he is here with him, and earlier when discussed states he could understand why he was here. He continues to be restless and had refused his Risperdal and Cogentin earlier because he said he is getting tired and doesn't need it, but right now still pacing.

## 2018-10-08 ENCOUNTER — Emergency Department (HOSPITAL_COMMUNITY)
Admission: EM | Admit: 2018-10-08 | Discharge: 2018-10-09 | Disposition: A | Discharge status: Other Acute Inpatient Hospital | Payer: Medicaid Other | Attending: Emergency Medicine | Admitting: Emergency Medicine

## 2018-10-08 DIAGNOSIS — F3110 Bipolar disorder, current episode manic without psychotic features, unspecified: Secondary | ICD-10-CM | POA: Diagnosis present

## 2018-10-08 DIAGNOSIS — F315 Bipolar disorder, current episode depressed, severe, with psychotic features: Secondary | ICD-10-CM | POA: Insufficient documentation

## 2018-10-08 DIAGNOSIS — Z20828 Contact with and (suspected) exposure to other viral communicable diseases: Secondary | ICD-10-CM | POA: Insufficient documentation

## 2018-10-08 DIAGNOSIS — Z046 Encounter for general psychiatric examination, requested by authority: Secondary | ICD-10-CM

## 2018-10-08 DIAGNOSIS — F17210 Nicotine dependence, cigarettes, uncomplicated: Secondary | ICD-10-CM | POA: Insufficient documentation

## 2018-10-08 DIAGNOSIS — H60501 Unspecified acute noninfective otitis externa, right ear: Secondary | ICD-10-CM | POA: Insufficient documentation

## 2018-10-08 DIAGNOSIS — F3010 Manic episode without psychotic symptoms, unspecified: Secondary | ICD-10-CM

## 2018-10-08 LAB — CBC WITH DIFFERENTIAL/PLATELET
Abs Immature Granulocytes: 0.03 10*3/uL (ref 0.00–0.07)
Basophils Absolute: 0.1 10*3/uL (ref 0.0–0.1)
Basophils Relative: 1 %
Eosinophils Absolute: 0.5 10*3/uL (ref 0.0–0.5)
Eosinophils Relative: 4 %
HCT: 46.9 % (ref 39.0–52.0)
Hemoglobin: 15.6 g/dL (ref 13.0–17.0)
Immature Granulocytes: 0 %
Lymphocytes Relative: 18 %
Lymphs Abs: 1.9 10*3/uL (ref 0.7–4.0)
MCH: 30.6 pg (ref 26.0–34.0)
MCHC: 33.3 g/dL (ref 30.0–36.0)
MCV: 92 fL (ref 80.0–100.0)
Monocytes Absolute: 1.1 10*3/uL — ABNORMAL HIGH (ref 0.1–1.0)
Monocytes Relative: 10 %
Neutro Abs: 7.4 10*3/uL (ref 1.7–7.7)
Neutrophils Relative %: 67 %
Platelets: 269 10*3/uL (ref 150–400)
RBC: 5.1 MIL/uL (ref 4.22–5.81)
RDW: 12.6 % (ref 11.5–15.5)
WBC: 11 10*3/uL — ABNORMAL HIGH (ref 4.0–10.5)
nRBC: 0 % (ref 0.0–0.2)

## 2018-10-08 LAB — COMPREHENSIVE METABOLIC PANEL
ALT: 20 U/L (ref 0–44)
AST: 25 U/L (ref 15–41)
Albumin: 4.7 g/dL (ref 3.5–5.0)
Alkaline Phosphatase: 49 U/L (ref 38–126)
Anion gap: 13 (ref 5–15)
BUN: 16 mg/dL (ref 6–20)
CO2: 20 mmol/L — ABNORMAL LOW (ref 22–32)
Calcium: 9.2 mg/dL (ref 8.9–10.3)
Chloride: 106 mmol/L (ref 98–111)
Creatinine, Ser: 1.1 mg/dL (ref 0.61–1.24)
GFR calc Af Amer: >60 mL/min (ref >60)
GFR calc non Af Amer: >60 mL/min (ref >60)
Glucose, Bld: 111 mg/dL — ABNORMAL HIGH (ref 70–99)
Potassium: 3.7 mmol/L (ref 3.5–5.1)
Sodium: 139 mmol/L (ref 135–145)
Total Bilirubin: 0.8 mg/dL (ref 0.3–1.2)
Total Protein: 7.2 g/dL (ref 6.5–8.1)

## 2018-10-08 LAB — RAPID URINE DRUG SCREEN, HOSP PERFORMED
Amphetamines: NOT DETECTED
Barbiturates: NOT DETECTED
Benzodiazepines: POSITIVE — AB
Cocaine: NOT DETECTED
Opiates: NOT DETECTED
Tetrahydrocannabinol: POSITIVE — AB

## 2018-10-08 LAB — SARS CORONAVIRUS 2 BY RT PCR (HOSPITAL ORDER, PERFORMED IN ~~LOC~~ HOSPITAL LAB): SARS Coronavirus 2: NEGATIVE

## 2018-10-08 LAB — ETHANOL: Alcohol, Ethyl (B): <10 mg/dL (ref <10)

## 2018-10-08 MED ORDER — HYDROXYZINE HCL 25 MG PO TABS
25.0000 mg | ORAL_TABLET | Freq: Three times a day (TID) | ORAL | Status: DC | PRN
  Administered 2018-10-09: 25 mg via ORAL
  Filled 2018-10-08: qty 1

## 2018-10-08 MED ORDER — NICOTINE 21 MG/24HR TD PT24
21.0000 mg | MEDICATED_PATCH | Freq: Every day | TRANSDERMAL | Status: DC
  Administered 2018-10-08 – 2018-10-09 (×2): 21 mg via TRANSDERMAL
  Filled 2018-10-08 (×2): qty 1

## 2018-10-08 MED ORDER — ACETAMINOPHEN 325 MG PO TABS
650.0000 mg | ORAL_TABLET | ORAL | Status: DC | PRN
  Administered 2018-10-09 (×2): 650 mg via ORAL
  Filled 2018-10-08 (×2): qty 2

## 2018-10-08 MED ORDER — NEOMYCIN-POLYMYXIN-HC 3.5-10000-1 OT SUSP
4.0000 [drp] | Freq: Three times a day (TID) | OTIC | Status: DC
  Administered 2018-10-08 – 2018-10-09 (×2): 4 [drp] via OTIC
  Filled 2018-10-08: qty 10

## 2018-10-08 MED ORDER — ALUM & MAG HYDROXIDE-SIMETH 200-200-20 MG/5ML PO SUSP: 30.0000 mL | Freq: Four times a day (QID) | ORAL | Status: DC | PRN

## 2018-10-08 MED ORDER — TRAZODONE HCL 50 MG PO TABS
150.0000 mg | ORAL_TABLET | Freq: Every day | ORAL | Status: DC
  Administered 2018-10-08: 150 mg via ORAL
  Filled 2018-10-08: qty 1

## 2018-10-08 MED ORDER — ONDANSETRON HCL 4 MG PO TABS
4.0000 mg | ORAL_TABLET | Freq: Three times a day (TID) | ORAL | Status: DC | PRN
  Administered 2018-10-09: 01:00:00 4 mg via ORAL
  Filled 2018-10-08: qty 1

## 2018-10-08 NOTE — ED Triage Notes (Signed)
Pt to ED via [employer] IVC from home. Per IVC :  "he is a danger to harm himself and/or others. He has been committed in 2004 and 2018 Behavior health, he just started threatening violence and very aggressive behavior towards self and family, this behavior is very out of the usual for him, he is walking the streets and driving when he is in no condition to safely do so. Family think he is using drugs but not sure. A lot of marijuana daily, will  Not take his medication and no sleep "

## 2018-10-08 NOTE — BH Assessment (Signed)
[facility] Assessment Progress Note    Case was staffed with Norman DO who recommended a inpatient admission.      

## 2018-10-08 NOTE — Care Management (Signed)
9/28: Under Review    [place] , [place], [place], [place], [place] , [place], [place], [place], [place], [place], [employer], [place], [place], [place], [place], [place], [employer], [place], [place], music, [place], [place], [place]

## 2018-10-08 NOTE — ED Provider Notes (Signed)
[facility]-EMERGENCY DEPT Provider Note   CSN: [id number] Arrival date & time: 10/08/18  1507     History   Chief Complaint Chief Complaint  Patient presents with  . IVC    HPI Mitchell Mckee is a 38 y.o. male with history of bipolar 1 disorder, substance-induced mood disorder, cannabis use disorder presents for evaluation under IVC.  IVC paperwork states that the patient is a danger to himself and others, has been aggressive towards self and family which is out of the ordinary for him.  Family thinks he may be using drugs but is unsure of but they do report that he has been noncompliant with his medications.  The patient tells me he has not been taking his Depakote because he does not like how it makes him feel.  Chart review shows the patient was seen and evaluated by behavioral health on 10/03/2018 and again while he was in the ED on 10/05/2018.  On my assessment he exhibits rapid pressured speech but is mostly calm and cooperative.  Denies any abdominal pain, chest pain, nausea, vomiting, shortness of breath, fevers.  He does report rashes that have been ongoing for several years as well as some irritation to the right external auditory canal.  States he has been scratching at the area and that it has been draining a small amount of serous fluid.  Denies any significant ear pain.  Reports rash to the abdomen that has been ongoing for for several months due to irritation from silver belt buckle.  He reports this has been improving after application of a cream and switching to a plastic belt buckle.  Notes his scalp has been dry for "years ", no improvement with dandruff shampoos.  Denies suicidal ideation, homicidal ideation.  States he has auditory hallucinations occasionally but none today.     The history is provided by the patient and the police.    Past Medical History:  Diagnosis Date  . Bipolar 1 disorder [facility](HCC)     Patient Active Problem List   Diagnosis  Date Noted  . Substance induced mood disorder (HCC) 08/15/2016  . Bipolar 1 disorder, manic, full remission (HCC) 08/05/2016  . Bipolar affective disorder, manic (HCC) 07/30/2016  . Cannabis use disorder, severe, dependence (HCC) 07/29/2016    No past surgical history on file.      Home Medications    Prior to Admission medications   Medication Sig Start Date End Date Taking? Authorizing Provider  hydrOXYzine (ATARAX/VISTARIL) 50 MG tablet Take 50 mg by mouth 3 (three) times daily as needed for anxiety. 08/03/16  Yes [provider]  benztropine (COGENTIN) 1 MG tablet Take 1 tablet (1 mg total) by mouth at bedtime. Patient not taking: Reported on 10/05/2018 08/11/16   Denzil Magnusonhomas, Lashunda, NP  divalproex (DEPAKOTE ER) 500 MG 24 hr tablet Take 2 tablets (1,000 mg total) by mouth at bedtime. Patient not taking: Reported on 10/05/2018 08/11/16   Denzil Magnusonhomas, Lashunda, NP  hydrOXYzine (ATARAX/VISTARIL) 25 MG tablet Take 1 tablet (25 mg total) by mouth 3 (three) times daily as needed for anxiety. Patient not taking: Reported on 08/14/2016 08/11/16   Denzil Magnusonhomas, Lashunda, NP  nicotine (NICODERM CQ - DOSED IN MG/24 HOURS) 21 mg/24hr patch Place 1 patch (21 mg total) onto the skin daily. Patient not taking: Reported on 10/05/2018 08/12/16   Denzil Magnusonhomas, Lashunda, NP  risperiDONE (RISPERDAL) 3 MG tablet Take 1 tablet (3 mg total) by mouth at bedtime. Patient not taking: Reported on 10/05/2018  08/11/16   Denzil Magnuson, NP  traZODone (DESYREL) 150 MG tablet Take 1 tablet (150 mg total) by mouth at bedtime. Patient not taking: Reported on 10/05/2018 08/11/16   Denzil Magnuson, NP    Family History No family history on file.  Social History Social History   Tobacco Use  . Smoking status: Current Every Day Smoker    Types: Cigarettes  . Smokeless tobacco: Never Used  Substance Use Topics  . Alcohol use: No    Alcohol/week: 0.0 standard drinks  . Drug use: Yes    Types: Marijuana     Allergies   Patient  has no known allergies.   Review of Systems Review of Systems  Constitutional: Negative for chills and fever.  Respiratory: Negative for shortness of breath.   Cardiovascular: Negative for chest pain.  Gastrointestinal: Negative for nausea and vomiting.  Skin: Positive for rash.  Psychiatric/Behavioral: Positive for sleep disturbance. Negative for hallucinations and suicidal ideas. The patient is nervous/anxious.   All other systems reviewed and are negative.    Physical Exam Updated Vital Signs BP (!) 136/96 (BP Location: Right Arm)   Pulse 94   Temp 98.4 F (36.9 C) (Oral)   Resp 18   Ht 5\' 10"  (1.778 m)   Wt 68 kg   SpO2 98%   BMI 21.52 kg/m   Physical Exam Vitals signs and nursing note reviewed.  Constitutional:      General: He is not in acute distress.    Appearance: He is well-developed.  HENT:     Head: Normocephalic and atraumatic.     Right Ear: Tympanic membrane normal. There is no impacted cerumen.     Left Ear: Tympanic membrane, ear canal and external ear normal. There is no impacted cerumen.     Ears:     Comments: Mild canal irritation to the external auditory canal of the right ear.  No mastoid tenderness or tenderness to palpation of the tragus or pinna.  No erythema or abnormal drainage.  No erythema or bulging of the TMs. Eyes:     General:        Right eye: No discharge.        Left eye: No discharge.     Conjunctiva/sclera: Conjunctivae normal.  Neck:     Vascular: No JVD.     Trachea: No tracheal deviation.  Cardiovascular:     Rate and Rhythm: Normal rate and regular rhythm.  Pulmonary:     Effort: Pulmonary effort is normal.     Breath sounds: Normal breath sounds.  Abdominal:     General: Bowel sounds are normal. There is no distension.     Palpations: Abdomen is soft.     Tenderness: There is no abdominal tenderness. There is no guarding or rebound.  Skin:    General: Skin is warm and dry.     Findings: Rash present. No erythema.      Comments: Contact dermatitis to the abdomen which patient reports has been ongoing for several months, improving with application of a cream.  He has a dry skin plaque to the scalp that he states has been present for years.   Neurological:     Mental Status: He is alert.  Psychiatric:        Attention and Perception: He does not perceive auditory hallucinations.        Speech: Speech is rapid and pressured.        Behavior: Behavior is cooperative.  Thought Content: Thought content does not include homicidal or suicidal ideation. Thought content does not include homicidal or suicidal plan.     Comments: Does not appear to be responding to internal stimuli at this time.      ED Treatments / Results  Labs (all labs ordered are listed, but only abnormal results are displayed) Labs Reviewed  COMPREHENSIVE METABOLIC PANEL - Abnormal; Notable for the following components:      Result Value   CO2 20 (*)    Glucose, Bld 111 (*)    All other components within normal limits  RAPID URINE DRUG SCREEN, HOSP PERFORMED - Abnormal; Notable for the following components:   Benzodiazepines POSITIVE (*)    Tetrahydrocannabinol POSITIVE (*)    All other components within normal limits  CBC WITH DIFFERENTIAL/PLATELET - Abnormal; Notable for the following components:   WBC 11.0 (*)    Monocytes Absolute 1.1 (*)    All other components within normal limits  SARS CORONAVIRUS 2 (HOSPITAL ORDER, [place] [facility])  ETHANOL    EKG None  Radiology No results found.  Procedures Procedures (including critical care time)  Medications Ordered in ED Medications  hydrOXYzine (ATARAX/VISTARIL) tablet 25 mg (has no administration in time range)  nicotine (NICODERM CQ - dosed in mg/24 hours) patch 21 mg (21 mg Transdermal Patch Applied 10/08/18 2020)  traZODone (DESYREL) tablet 150 mg (has no administration in time range)  acetaminophen (TYLENOL) tablet 650 mg (has no  administration in time range)  ondansetron (ZOFRAN) tablet 4 mg (has no administration in time range)  alum & mag hydroxide-simeth (MAALOX/MYLANTA) 200-200-20 MG/5ML suspension 30 mL (has no administration in time range)  acetic acid-hydrocortisone(VOSOL-HC) OTIC (EAR) solution 2-1% (has no administration in time range)     Initial Impression / Assessment and Plan / ED Course  I have reviewed the triage vital signs and the nursing notes.  Pertinent labs & imaging results that were available during my care of the patient were reviewed by me and considered in my medical decision making (see chart for details).        Patient presenting for evaluation under IVC.  He is afebrile, vital signs are stable in the ED.  He is nontoxic in appearance.  He appears acutely manic with rapid pressured speech.  Physical examination is reassuring although he does have contact dermatitis to his abdomen which is improving.  He has chronic rash to his scalp which appears suggestive of psoriasis.  He has some mild right external auditory canal edema and irritation due to chronic scratching of the area but no evidence of mastoiditis or malignant otitis externa.  Will prescribe eardrops for mild otitis externa.  Remainder physical examination is reassuring.  Screening labs reviewed by myself show mild nonspecific leukocytosis, no metabolic derangements, no renal insufficiency.  UDS is positive for THC and benzodiazepines.  He is medically cleared for TTS evaluation at this time.  TTS recommends inpatient admission.  Final Clinical Impressions(s) / ED Diagnoses   Final diagnoses:  Manic behavior ([place])  Involuntary commitment  Acute non-infective otitis externa of right ear, unspecified type    ED Discharge Orders    None       Debroah Baller 10/08/18 2140    Lucrezia Starch, MD 10/10/18 [phone]

## 2018-10-08 NOTE — ED Notes (Signed)
Pt  fiance to ED to drop of papers r/t patient text messages he has sent to family, threatening family etc.. Waunita Schooner- TTS counselor made aware, papers placed in pt shadow chart.

## 2018-10-08 NOTE — ED Notes (Signed)
EDP at bedside  

## 2018-10-08 NOTE — ED Notes (Signed)
Specimen cup provided, pt encouraged to provide urine sample

## 2018-10-08 NOTE — ED Notes (Signed)
Excessive talking, frequent questions and comments and seeks reassurance. Pleasant, no behavior problems. Showered. Asked to get some of his numbers off his phone which was in the locker. He was allowed to do so and made one ten min phone call. States afterwards he felt much calmer having made the call and thanked Probation officer for the opportunity to call. Phone returned to his property.

## 2018-10-08 NOTE — BH Assessment (Signed)
Assessment Note  Mitchell Mckee is an 38 y.o. male that presents this date with IVC. Per IVC patient was noted to be a danger to himself and others as evidenced by patient making threats to family members and current girlfriend. Patient has also been participating in high risk behaviors to include using excessive amounts of Cannabis and driving while impaired. Collateral information gathered this date from patient's partner Meyer Cory [phone] and father Kirklin Mcduffee [phone] report that patient has been texting threatening messages (copy of texts currently in chart) stating he will "get out his gun, rip your balls off and shove them up your throat" and other threatening comments made to patient's girlfriend and father. Patient when questioned in reference to content of texts does admit that he sent them although states he "was just trying to get attention." Patient denies any S/I, H/I this date although reports active AVH at times although will not elaborate on content. Patient was last assessed on 10/06/18 when he presented with similar symptoms although did not meet inpatient criteria at that time. Patient does state this date that he has had recent mood swings from discontinuing his Depakote due to "making his hair fall out" although denies any other mental health symptoms at this time. Patient is currently receiving OP services from the [facility] who assists with medication management for symptoms of Bipolar. Per notes this date patient has a noted history of bipolar 1 disorder, substance-induced mood disorder and cannabis use. IVC paperwork states that the patient is a danger to himself and others, has been aggressive towards self and family which is out of the ordinary for him. Family thinks he may be using drugs but is unsure of but they do report that he has been noncompliant with his medications. Patient denies any SA issues with UDS pending this date. Chart review shows the  patient was seen and evaluated by behavioral health on 10/03/2018 and again while he was in the ED on 10/05/2018. Patient this date exhibits rapid pressured speech but is mostly  cooperative although renders limited history.  Patient is minimizing the incident. Patient is oriented x 4 and is noted to be anxious at times. Case was staffed with Mariea Clonts DO who recommended a inpatient admission.      Diagnosis: F31.5 Bipolar disorder most recent episode with psychotic features  Past Medical History:  Past Medical History:  Diagnosis Date  . Bipolar 1 disorder ([place])     No past surgical history on file.  Family History: No family history on file.  Social History:  reports that he has been smoking cigarettes. He has never used smokeless tobacco. He reports current drug use. Drug: Marijuana. He reports that he does not drink alcohol.  Additional Social History:  Alcohol / Drug Use Pain Medications: See MAR Prescriptions: See MAR Over the Counter: See MAR History of alcohol / drug use?: Yes Longest period of sobriety (when/how long): Unknown Substance #1 Name of Substance 1: Cannabis per hx 1 - Age of First Use: UTA 1 - Amount (size/oz): UTA 1 - Frequency: UTA 1 - Duration: UTA 1 - Last Use / Amount: UTA UDS pending Substance #2 Name of Substance 2: Alcohol per history 2 - Age of First Use: UTA 2 - Amount (size/oz): UTA 2 - Frequency: UTA 2 - Duration: UTA 2 - Last Use / Amount: UTA BAL  CIWA: CIWA-Ar BP: 138/83 Pulse Rate: (!) 102 COWS:    Allergies: No Known Allergies  Home Medications: (Not  in a hospital admission)   OB/GYN Status:  No LMP for male patient.  General Assessment Data Location of Assessment: [facility] ED TTS Assessment: In system Is this a Tele or Face-to-Face Assessment?: Face-to-Face Is this an Initial Assessment or a Re-assessment for this encounter?: Initial Assessment Patient Accompanied by:: N/A Language Other than English: No Living Arrangements: Other  (Comment)(Alone) What gender do you identify as?: Male Marital status: Single Living Arrangements: Spouse/significant other Can pt return to current living arrangement?: Yes Admission Status: Involuntary Petitioner: Family member Is patient capable of signing voluntary admission?: Yes Referral Source: Self/Family/Friend Insurance type: Self pay     Crisis Care Plan Living Arrangements: Spouse/significant other Legal Guardian: Other:(Self) Name of Psychiatrist: [facility] of Therapist: [facility]  Education Status Is patient currently in school?: No Is the patient employed, unemployed or receiving disability?: Employed  Risk to self with the past 6 months Suicidal Ideation: No Has patient been a risk to self within the past 6 months prior to admission? : No Suicidal Intent: No Has patient had any suicidal intent within the past 6 months prior to admission? : No Is patient at risk for suicide?: No, but patient needs Medical Clearance Suicidal Plan?: No Has patient had any suicidal plan within the past 6 months prior to admission? : No Access to Means: No Specify Access to Suicidal Means: NA What has been your use of drugs/alcohol within the last 12 months?: Current use per hx Previous Attempts/Gestures: No How many times?: 0 Other Self Harm Risks: (Off medications ) Triggers for Past Attempts: (NA) Intentional Self Injurious Behavior: None Family Suicide History: No Recent stressful life event(s): Other (Comment)(Off medications ) Persecutory voices/beliefs?: No Depression: No Depression Symptoms: (Denies symptoms ) Substance abuse history and/or treatment for substance abuse?: No Suicide prevention information given to non-admitted patients: Not applicable  Risk to Others within the past 6 months Homicidal Ideation: No Does patient have any lifetime risk of violence toward others beyond the six months prior to admission? : No Thoughts of Harm to Others:  Yes-Currently Present Comment - Thoughts of Harm to Others: Threats to family Current Homicidal Intent: No Current Homicidal Plan: No Access to Homicidal Means: No Describe Access to Homicidal Means: (NA) Identified Victim: (NA) History of harm to others?: Yes Assessment of Violence: On admission Violent Behavior Description: Threats to family Does patient have access to weapons?: No Criminal Charges Pending?: No Does patient have a court date: No Is patient on probation?: No  Psychosis Hallucinations: None noted Delusions: None noted  Mental Status Report Appearance/Hygiene: In scrubs Eye Contact: Fair Motor Activity: Agitation Speech: Pressured, Loud Level of Consciousness: Irritable Mood: Anxious Affect: Angry Anxiety Level: Moderate Thought Processes: Coherent, Relevant Judgement: Partial Orientation: Person, Place, Time Obsessive Compulsive Thoughts/Behaviors: None  Cognitive Functioning Concentration: Decreased Memory: Recent Intact, Remote Intact Is patient IDD: No Insight: Fair Impulse Control: Fair Appetite: Good Have you had any weight changes? : No Change Sleep: No Change Total Hours of Sleep: 7 Vegetative Symptoms: None  ADLScreening [facility] Assessment Services) Patient's cognitive ability adequate to safely complete daily activities?: Yes Patient able to express need for assistance with ADLs?: Yes Independently performs ADLs?: Yes (appropriate for developmental age)  Prior Inpatient Therapy Prior Inpatient Therapy: Yes Prior Therapy Dates: 2018, 2002 [facility] Reason for Treatment: MH issues  Prior Outpatient Therapy Prior Outpatient Therapy: Yes Prior Therapy Dates: Ongoing Prior Therapy Facilty/Provider([facility]): [facility] Reason for Treatment: Med mang Does patient have an ACCT  team?: No Does patient have Intensive In-House Services?  : No Does patient have [facility] services? : No Does patient have P4CC  services?: No  ADL Screening (condition at time of admission) Patient's cognitive ability adequate to safely complete daily activities?: Yes Is the patient deaf or have difficulty hearing?: No Does the patient have difficulty seeing, even when wearing glasses/contacts?: No Does the patient have difficulty concentrating, remembering, or making decisions?: No Patient able to express need for assistance with ADLs?: Yes Does the patient have difficulty dressing or bathing?: No Independently performs ADLs?: Yes (appropriate for developmental age) Does the patient have difficulty walking or climbing stairs?: No Weakness of Legs: None Weakness of Arms/Hands: None  Home Assistive Devices/Equipment Home Assistive Devices/Equipment: None  Therapy Consults (therapy consults require a physician order) PT Evaluation Needed: No OT Evalulation Needed: No SLP Evaluation Needed: No Abuse/Neglect Assessment (Assessment to be complete while patient is alone) Physical Abuse: Denies Verbal Abuse: Denies Sexual Abuse: Denies Exploitation of patient/patient's resources: Denies Self-Neglect: Denies Values / Beliefs Cultural Requests During Hospitalization: None Spiritual Requests During Hospitalization: None Consults Spiritual Care Consult Needed: No Social Work Consult Needed: No Merchant navy officer (For Healthcare) Does Patient Have a Medical Advance Directive?: No Would patient like information on creating a medical advance directive?: No - Patient declined          Disposition: Case was staffed with Sharma Covert DO who recommended a inpatient admission.  Disposition Initial Assessment Completed for this Encounter: Yes Disposition of Patient: Admit( ) Type of inpatient treatment program: Adult Patient refused recommended treatment: No Mode of transportation if patient is discharged/movement?: Loreli Slot)  On Site Evaluation by:   Reviewed with Physician:    Alfredia Ferguson 10/08/2018 6:10 PM

## 2018-10-09 ENCOUNTER — Encounter (HOSPITAL_COMMUNITY): Payer: Self-pay | Admitting: Registered Nurse

## 2018-10-09 ENCOUNTER — Other Ambulatory Visit: Payer: Self-pay

## 2018-10-09 ENCOUNTER — Encounter (HOSPITAL_COMMUNITY): Payer: Self-pay

## 2018-10-09 ENCOUNTER — Other Ambulatory Visit: Payer: Self-pay | Admitting: Registered Nurse

## 2018-10-09 ENCOUNTER — Inpatient Hospital Stay (HOSPITAL_COMMUNITY)
Admission: AD | Admit: 2018-10-09 | Discharge: 2018-10-17 | DRG: 885 | Disposition: A | Discharge status: Home / Self Care | Payer: Medicaid Other | Source: Intra-hospital | Attending: Psychiatry | Admitting: Psychiatry

## 2018-10-09 DIAGNOSIS — L659 Nonscarring hair loss, unspecified: Secondary | ICD-10-CM | POA: Diagnosis present

## 2018-10-09 DIAGNOSIS — F419 Anxiety disorder, unspecified: Secondary | ICD-10-CM | POA: Diagnosis not present

## 2018-10-09 DIAGNOSIS — Z79899 Other long term (current) drug therapy: Secondary | ICD-10-CM

## 2018-10-09 DIAGNOSIS — F3174 Bipolar disorder, in full remission, most recent episode manic: Secondary | ICD-10-CM | POA: Diagnosis present

## 2018-10-09 DIAGNOSIS — F17210 Nicotine dependence, cigarettes, uncomplicated: Secondary | ICD-10-CM | POA: Diagnosis not present

## 2018-10-09 DIAGNOSIS — F319 Bipolar disorder, unspecified: Secondary | ICD-10-CM | POA: Diagnosis present

## 2018-10-09 DIAGNOSIS — F3113 Bipolar disorder, current episode manic without psychotic features, severe: Secondary | ICD-10-CM | POA: Diagnosis not present

## 2018-10-09 DIAGNOSIS — F312 Bipolar disorder, current episode manic severe with psychotic features: Secondary | ICD-10-CM | POA: Diagnosis not present

## 2018-10-09 DIAGNOSIS — F3160 Bipolar disorder, current episode mixed, unspecified: Secondary | ICD-10-CM | POA: Diagnosis not present

## 2018-10-09 DIAGNOSIS — Z23 Encounter for immunization: Secondary | ICD-10-CM

## 2018-10-09 DIAGNOSIS — G4700 Insomnia, unspecified: Secondary | ICD-10-CM | POA: Diagnosis present

## 2018-10-09 DIAGNOSIS — K146 Glossodynia: Secondary | ICD-10-CM | POA: Diagnosis not present

## 2018-10-09 MED ORDER — OLANZAPINE 10 MG PO TABS
10.0000 mg | ORAL_TABLET | Freq: Every day | ORAL | Status: DC
  Administered 2018-10-09 – 2018-10-11 (×3): 10 mg via ORAL
  Filled 2018-10-09 (×5): qty 1

## 2018-10-09 MED ORDER — OLANZAPINE 5 MG PO TBDP
5.0000 mg | ORAL_TABLET | Freq: Three times a day (TID) | ORAL | Status: DC | PRN
  Filled 2018-10-09: qty 1

## 2018-10-09 MED ORDER — ZIPRASIDONE MESYLATE 20 MG IM SOLR: 20.0000 mg | INTRAMUSCULAR | Status: DC | PRN

## 2018-10-09 MED ORDER — LORAZEPAM 1 MG PO TABS: 1.0000 mg | ORAL_TABLET | Freq: Three times a day (TID) | ORAL | Status: DC | PRN

## 2018-10-09 MED ORDER — OLANZAPINE 10 MG PO TABS
10.0000 mg | ORAL_TABLET | Freq: Two times a day (BID) | ORAL | Status: DC
  Filled 2018-10-09 (×2): qty 1

## 2018-10-09 MED ORDER — LORAZEPAM 1 MG PO TABS
1.0000 mg | ORAL_TABLET | ORAL | Status: AC | PRN
  Administered 2018-10-11: 1 mg via ORAL
  Filled 2018-10-09: qty 1

## 2018-10-09 MED ORDER — LORAZEPAM 1 MG PO TABS
1.0000 mg | ORAL_TABLET | Freq: Three times a day (TID) | ORAL | Status: DC | PRN
  Administered 2018-10-09 – 2018-10-10 (×2): 1 mg via ORAL
  Filled 2018-10-09 (×2): qty 1

## 2018-10-09 NOTE — [facility] Suicide Risk Assessment (Signed)
[facility] Admission Suicide Risk Assessment   Nursing information obtained from:  Patient Demographic factors:  Caucasian, Low socioeconomic status Current Mental Status:  NA Loss Factors:  Decline in physical health Historical Factors:  Prior suicide attempts, Impulsivity, Family history of mental illness or substance abuse Risk Reduction Factors:  Sense of responsibility to family, Employed, Positive social support, Responsible for children under 69 years of age, Living with another person, especially a relative, Positive therapeutic relationship  Total Time spent with patient: 45 minutes Principal Problem:  Bipolar Disorder, Manic Diagnosis:  Bipolar Disorder , Manic  Subjective Data:   Continued Clinical Symptoms:  Alcohol Use Disorder Identification Test Final Score (AUDIT): 2 The "Alcohol Use Disorders Identification Test", Guidelines for Use in Primary Care, Second Edition.  World Pharmacologist [facility]). Score between 0-7:  no or low risk or alcohol related problems. Score between 8-15:  moderate risk of alcohol related problems. Score between 16-19:  high risk of alcohol related problems. Score 20 or above:  warrants further diagnostic evaluation for alcohol dependence and treatment.   CLINICAL FACTORS:  38 year old male, history of bipolar disorder.  Had recently presented to ED describing incipient symptoms of mania.  Improved with treatment, was discharged.  Returned to ED under IV commitment initiated by family due to disorganized, manic behaviors including threatening family, behaving aggressively.  Patient presents with pressured speech, disorganized thought process, expansive affect.  Has not been taking prescribed psychiatric medications.    Psychiatric Specialty Exam: Physical Exam  ROS  Blood pressure (!) 142/101, pulse 98, temperature 98 F (36.7 C), temperature source Oral, resp. rate 16, height 5\' 10"  (1.778 m), weight 65.8 kg, SpO2 100 %.Body mass index is 20.81  kg/m.  See admit note MSE   COGNITIVE FEATURES THAT CONTRIBUTE TO RISK:  Closed-mindedness and Loss of executive function    SUICIDE RISK:   Moderate:  Frequent suicidal ideation with limited intensity, and duration, some specificity in terms of plans, no associated intent, good self-control, limited dysphoria/symptomatology, some risk factors present, and identifiable protective factors, including available and accessible social support.  PLAN OF CARE: Patient will be admitted to inpatient psychiatric unit for stabilization and safety. Will provide and encourage milieu participation. Provide medication management and maked adjustments as needed.  Will follow daily.    I certify that inpatient services furnished can reasonably be expected to improve the patient's condition.   Jenne Campus, MD 10/09/2018, 6:34 PM

## 2018-10-09 NOTE — ED Notes (Signed)
Up in room, talking, speech rapid.

## 2018-10-09 NOTE — ED Notes (Signed)
[facility] contacted for transport 
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
## 2018-10-09 NOTE — ED Notes (Signed)
Waiting for eval, pt reports that he was diagnosed w/ bipolar several years ago.  Pt very talkative, pleasant, and reports that he is feeling  better after the visteril.

## 2018-10-09 NOTE — ED Notes (Signed)
On the phone 

## 2018-10-09 NOTE — ED Notes (Signed)
Up to the bathroom  To shower and change scrubs
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
## 2018-10-09 NOTE — Progress Notes (Signed)
The patient shared in group that he was happy to be here versus being in [facility]. He states that he is full of anxiety and that he is feeling manic. The patient was redirected for not allowing one of his peers to express himself in group which nearly led to a confrontation. His goal for tomorrow is to work on his " to do list" which he said will lead him to be up all night.

## 2018-10-09 NOTE — ED Notes (Signed)
Complained of feeling cold again and asked for his vitals to be rechecked. Temp 99. And was given Tylenol as ordered for slightly elevated temp.

## 2018-10-09 NOTE — ED Notes (Signed)
Report called to Beverly RN.

## 2018-10-09 NOTE — Tx Team (Signed)
Initial Treatment Plan 10/09/2018 3:58 PM Mitchell Mckee [id number]    PATIENT STRESSORS: Marital or family conflict Medication change or noncompliance Substance abuse   PATIENT STRENGTHS: Active sense of humor Supportive family/friends Work skills   PATIENT IDENTIFIED PROBLEMS: "mania"  anxiety                   DISCHARGE CRITERIA:  Ability to meet basic life and health needs Improved stabilization in mood, thinking, and/or behavior Motivation to continue treatment in a less acute level of care  PRELIMINARY DISCHARGE PLAN: Outpatient therapy Participate in family therapy Return to previous living arrangement Return to previous work or school arrangements  PATIENT/FAMILY INVOLVEMENT: This treatment plan has been presented to and reviewed with the patient, Mitchell Mckee.  The patient and family have been given the opportunity to ask questions and make suggestions.  Baron Sane, RN 10/09/2018, 3:58 PM

## 2018-10-09 NOTE — ED Notes (Signed)
Woke up abruptly and to the bathroom vomiting repeatedly. Writer witnessed emesis, food contents and liquid. Asked why he thought he was getting sick, "too many pills in my stomach maybe" Only meds he has had this shift is Trazodone. He has Zofran ordered and will give it to him when his stomach settles some. He voices no other complaints.

## 2018-10-09 NOTE — ED Notes (Addendum)
Up to the desk, denies si/hi/avh at this time.  Pt pleasant, talkative, nad.  Pt given shower shoes w/ relief from pain
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
## 2018-10-09 NOTE — ED Notes (Signed)
Tele-psych eval in progress.

## 2018-10-09 NOTE — ED Notes (Signed)
Pt ambulatory w/o difficulty to [facility] with [employer], IVC papers/ belongings given to officers.

## 2018-10-09 NOTE — ED Notes (Signed)
Pt to be admitted 400-1

## 2018-10-09 NOTE — BH Assessment (Signed)
[facility] Assessment Progress Note  Per Buford Dresser, DO, this pt requires psychiatric hospitalization.  Hampton Abbot, MD has assigned pt to [facility] Rm 400-1.  Pt presents under IVC initiated by pt's father, and upheld by Dr Mariea Clonts, and IVC documents have been faxed to [facility].  Pt's nurse, Narda Rutherford, has been notified, and agrees to call report to [phone].  Pt is to be transported via [facility].   Jalene Mullet, [place] Coordinator [phone]

## 2018-10-09 NOTE — ED Notes (Signed)
Pt up in iroom, nad, talkative, pleasant.

## 2018-10-09 NOTE — ED Notes (Signed)
Pt up to the desk and is aware that he is going to be admitted to [facility]

## 2018-10-09 NOTE — ED Notes (Signed)
Up in room, talkative, reports pain has improved but still rates it at 10/10

## 2018-10-10 LAB — LIPID PANEL
Cholesterol: 103 mg/dL (ref 0–200)
HDL: 34 mg/dL — ABNORMAL LOW (ref >40)
LDL Cholesterol: 54 mg/dL (ref 0–99)
Total CHOL/HDL Ratio: 3 RATIO
Triglycerides: 74 mg/dL (ref <150)
VLDL: 15 mg/dL (ref 0–40)

## 2018-10-10 LAB — TSH: TSH: 2.427 u[IU]/mL (ref 0.350–4.500)

## 2018-10-10 LAB — HEMOGLOBIN A1C
Hgb A1c MFr Bld: 5 % (ref 4.8–5.6)
Mean Plasma Glucose: 96.8 mg/dL

## 2018-10-10 MED ORDER — OLANZAPINE 5 MG PO TABS
5.0000 mg | ORAL_TABLET | ORAL | Status: DC
  Administered 2018-10-10 – 2018-10-12 (×3): 5 mg via ORAL
  Filled 2018-10-10: qty 1
  Filled 2018-10-10: qty 2
  Filled 2018-10-10: qty 1
  Filled 2018-10-10: qty 2
  Filled 2018-10-10 (×2): qty 1

## 2018-10-10 MED ORDER — LORAZEPAM 0.5 MG PO TABS
0.5000 mg | ORAL_TABLET | Freq: Three times a day (TID) | ORAL | Status: DC | PRN
  Administered 2018-10-10 – 2018-10-15 (×3): 0.5 mg via ORAL
  Filled 2018-10-10 (×3): qty 1

## 2018-10-10 MED ORDER — NICOTINE 21 MG/24HR TD PT24
21.0000 mg | MEDICATED_PATCH | Freq: Every day | TRANSDERMAL | Status: DC
  Administered 2018-10-10 – 2018-10-17 (×8): 21 mg via TRANSDERMAL
  Filled 2018-10-10 (×10): qty 1

## 2018-10-10 MED ORDER — BENZOCAINE 10 % MT GEL
Freq: Three times a day (TID) | OROMUCOSAL | Status: DC | PRN
  Administered 2018-10-10: 14:00:00 via OROMUCOSAL
  Filled 2018-10-10: qty 9.4

## 2018-10-10 MED ORDER — OLANZAPINE 5 MG PO TABS: 5.0000 mg | ORAL_TABLET | ORAL | Status: DC

## 2018-10-10 MED ORDER — IBUPROFEN 600 MG PO TABS
600.0000 mg | ORAL_TABLET | Freq: Four times a day (QID) | ORAL | Status: DC | PRN
  Administered 2018-10-10 – 2018-10-15 (×10): 600 mg via ORAL
  Filled 2018-10-10 (×10): qty 1

## 2018-10-10 MED ORDER — LORAZEPAM 1 MG PO TABS
1.0000 mg | ORAL_TABLET | Freq: Two times a day (BID) | ORAL | Status: DC
  Administered 2018-10-10 – 2018-10-16 (×11): 1 mg via ORAL
  Filled 2018-10-10 (×11): qty 1

## 2018-10-10 NOTE — BHH Suicide Risk Assessment (Signed)
[facility] INPATIENT:  Family/Significant Other Suicide Prevention Education  Suicide Prevention Education:  Patient Refusal for Family/Significant Other Suicide Prevention Education: The patient Mitchell Mckee has refused to provide written consent for family/significant other to be provided Family/Significant Other Suicide Prevention Education during admission and/or prior to discharge.  Physician notified.  Joellen Jersey 10/10/2018, 3:13 PM

## 2018-10-10 NOTE — Plan of Care (Signed)
Patient experienced anxiety, agitations and restlessness. Complaining of tooth pain. Irritable and intrusive. Was medicated with Ativan and Ibuprofen. Patient is irritable but redirectable. Currently sleeping soundly. No sign of distress. Safety precautions maintained.

## 2018-10-10 NOTE — Progress Notes (Signed)
Patient's relative, Leah, called last evening. She stated that she had a number of concerns regarding the patient. First of all, she stated that the patient was not receiving the proper care that he needs at this hospital. Secondly, she indicated that [facility] is more than willing to treat the patient on an inpatient basis. In addition, she indicated that she is afraid of the patient Finally, she mentioned that it will take approximately 6-8 weeks to "straighten out" the patient. She complained that this hospital will not hold him long enough and then will end up discharging him too soon after which he will not take his medication. Marland Kitchen

## 2018-10-10 NOTE — Tx Team (Signed)
Interdisciplinary Treatment and Diagnostic Plan Update  10/10/2018 Time of Session: 9:25am Mitchell Mckee MRN: [id number]  Principal Diagnosis: <principal problem not specified>  Secondary Diagnoses: Active Problems:   * No active hospital problems. *   Current Medications:  Current Facility-Administered Medications  Medication Dose Route Frequency Provider Last Rate Last Dose  . ibuprofen (ADVIL) tablet 600 mg  600 mg Oral Q6H PRN Benton, Rashawnda L, RN   600 mg at 10/10/18 0800  . OLANZapine zydis (ZYPREXA) disintegrating tablet 5 mg  5 mg Oral Q8H PRN Cobos, Myer Peer, MD       And  . LORazepam (ATIVAN) tablet 1 mg  1 mg Oral PRN Cobos, Myer Peer, MD       And  . ziprasidone (GEODON) injection 20 mg  20 mg Intramuscular PRN Cobos, Myer Peer, MD      . LORazepam (ATIVAN) tablet 1 mg  1 mg Oral Q8H PRN Cobos, Myer Peer, MD   1 mg at 10/10/18 0801  . OLANZapine (ZYPREXA) tablet 10 mg  10 mg Oral QHS Cobos, Myer Peer, MD   10 mg at 10/09/18 2142   PTA Medications: Medications Prior to Admission  Medication Sig Dispense Refill Last Dose  . benztropine (COGENTIN) 1 MG tablet Take 1 tablet (1 mg total) by mouth at bedtime. (Patient not taking: Reported on 10/05/2018) 30 tablet 0   . divalproex (DEPAKOTE ER) 500 MG 24 hr tablet Take 2 tablets (1,000 mg total) by mouth at bedtime. (Patient not taking: Reported on 10/05/2018) 60 tablet 0   . hydrOXYzine (ATARAX/VISTARIL) 25 MG tablet Take 1 tablet (25 mg total) by mouth 3 (three) times daily as needed for anxiety. (Patient not taking: Reported on 08/14/2016) 30 tablet 0   . hydrOXYzine (ATARAX/VISTARIL) 50 MG tablet Take 50 mg by mouth 3 (three) times daily as needed for anxiety.  0   . nicotine (NICODERM CQ - DOSED IN MG/24 HOURS) 21 mg/24hr patch Place 1 patch (21 mg total) onto the skin daily. (Patient not taking: Reported on 10/05/2018) 28 patch 0   . risperiDONE (RISPERDAL) 3 MG tablet Take 1 tablet (3 mg total) by mouth at bedtime.  (Patient not taking: Reported on 10/05/2018) 30 tablet 0   . traZODone (DESYREL) 150 MG tablet Take 1 tablet (150 mg total) by mouth at bedtime. (Patient not taking: Reported on 10/05/2018) 30 tablet 0     Patient Stressors: Marital or family conflict Medication change or noncompliance Substance abuse  Patient Strengths: Active sense of humor Supportive family/friends Work skills  Treatment Modalities: Medication Management, Group therapy, Case management,  1 to 1 session with clinician, Psychoeducation, Recreational therapy.   Physician Treatment Plan for Primary Diagnosis: <principal problem not specified> Long Term Goal(s): Improvement in symptoms so as ready for discharge Improvement in symptoms so as ready for discharge   Short Term Goals: Ability to identify changes in lifestyle to reduce recurrence of condition will improve Ability to verbalize feelings will improve Ability to disclose and discuss suicidal ideas Ability to demonstrate self-control will improve Ability to identify and develop effective coping behaviors will improve Ability to maintain clinical measurements within normal limits will improve Ability to identify changes in lifestyle to reduce recurrence of condition will improve Ability to verbalize feelings will improve Ability to disclose and discuss suicidal ideas Ability to demonstrate self-control will improve Ability to identify and develop effective coping behaviors will improve Ability to maintain clinical measurements within normal limits will improve  Medication Management: Evaluate  patient's response, side effects, and tolerance of medication regimen.  Therapeutic Interventions: 1 to 1 sessions, Unit Group sessions and Medication administration.  Evaluation of Outcomes: Not Met  Physician Treatment Plan for Secondary Diagnosis: Active Problems:   * No active hospital problems. *  Long Term Goal(s): Improvement in symptoms so as ready for  discharge Improvement in symptoms so as ready for discharge   Short Term Goals: Ability to identify changes in lifestyle to reduce recurrence of condition will improve Ability to verbalize feelings will improve Ability to disclose and discuss suicidal ideas Ability to demonstrate self-control will improve Ability to identify and develop effective coping behaviors will improve Ability to maintain clinical measurements within normal limits will improve Ability to identify changes in lifestyle to reduce recurrence of condition will improve Ability to verbalize feelings will improve Ability to disclose and discuss suicidal ideas Ability to demonstrate self-control will improve Ability to identify and develop effective coping behaviors will improve Ability to maintain clinical measurements within normal limits will improve     Medication Management: Evaluate patient's response, side effects, and tolerance of medication regimen.  Therapeutic Interventions: 1 to 1 sessions, Unit Group sessions and Medication administration.  Evaluation of Outcomes: Not Met   RN Treatment Plan for Primary Diagnosis: <principal problem not specified> Long Term Goal(s): Knowledge of disease and therapeutic regimen to maintain health will improve  Short Term Goals: Ability to participate in decision making will improve, Ability to verbalize feelings will improve, Ability to disclose and discuss suicidal ideas and Ability to identify and develop effective coping behaviors will improve  Medication Management: RN will administer medications as ordered by provider, will assess and evaluate patient's response and provide education to patient for prescribed medication. RN will report any adverse and/or side effects to prescribing provider.  Therapeutic Interventions: 1 on 1 counseling sessions, Psychoeducation, Medication administration, Evaluate responses to treatment, Monitor vital signs and CBGs as ordered,  Perform/monitor CIWA, COWS, AIMS and Fall Risk screenings as ordered, Perform wound care treatments as ordered.  Evaluation of Outcomes: Not Met   LCSW Treatment Plan for Primary Diagnosis: <principal problem not specified> Long Term Goal(s): Safe transition to appropriate next level of care at discharge, Engage patient in therapeutic group addressing interpersonal concerns.  Short Term Goals: Engage patient in aftercare planning with referrals and resources  Therapeutic Interventions: Assess for all discharge needs, 1 to 1 time with Social worker, Explore available resources and support systems, Assess for adequacy in community support network, Educate family and significant other(s) on suicide prevention, Complete Psychosocial Assessment, Interpersonal group therapy.  Evaluation of Outcomes: Not Met   Progress in Treatment: Attending groups: No. Participating in groups: No. Taking medication as prescribed: Yes. Toleration medication: Yes. Family/Significant other contact made: No, will contact:  if patient consents to collateral contacts Patient understands diagnosis: Yes. Discussing patient identified problems/goals with staff: Yes. Medical problems stabilized or resolved: Yes. Denies suicidal/homicidal ideation: Yes. Issues/concerns per patient self-inventory: No. Other:   New problem(s) identified: None   New Short Term/Long Term Goal(s):  medication stabilization, elimination of SI thoughts, development of comprehensive mental wellness plan.    Patient Goals:  "I just need to get out of here. I am a little manic because I am here. I need some new Bipolar medicine"  Discharge Plan or Barriers: Patient recently admitted. CSW will continue to follow and assess for appropriate referrals and possible discharge planning.    Reason for Continuation of Hospitalization: Anxiety Depression Mania Medication stabilization  Estimated  Length of Stay: 3-5 days    Attendees: Patient: Mitchell Mckee 10/10/2018 10:06 AM  Physician: Dr. Fernando Cobos, MD 10/10/2018 10:06 AM  Nursing: Elizabeth.O, RN 10/10/2018 10:06 AM  RN Care Manager: 10/10/2018 10:06 AM  Social Worker:  , LCSW 10/10/2018 10:06 AM  Recreational Therapist:  10/10/2018 10:06 AM  Other: Janet Sykes, NP  10/10/2018 10:06 AM  Other:  10/10/2018 10:06 AM  Other: 10/10/2018 10:06 AM    Scribe for Treatment Team:  E , LCSWA 10/10/2018 10:06 AM 

## 2018-10-10 NOTE — Progress Notes (Signed)
Patient would like orajel for his mouth pain and ear drops.  Stated he may have ear infection, ears are stopped up.

## 2018-10-10 NOTE — Progress Notes (Signed)
The positive change that the paitient wants to make is that he wants to spend more time with his children. His goal for tomorrow is to get discharged or transfer to another hallway. He continues to complain of tooth pain and mouth pain.

## 2018-10-10 NOTE — BHH Counselor (Signed)
Adult Comprehensive Assessment  Patient ID: Mitchell Mckee, male   DOB: April 25, 1980, 38 y.o.   MRN: [id number]  Information Source: Information source: Patient  Current Stressors:  Patient states their primary concerns and needs for treatment are:: "My parents contained me." IVC'd by parents for mania. Reports this is the 3rd time he was brought to [facility] for a psych evaluation. Patient states their goals for this hospitilization and ongoing recovery are:: Flight of ideas, identified several goals. Wants to discharge soon and follow up with [facility]. Educational / Learning stressors: Denies, but wants to go back to school Employment / Job issues: Employed at [employer]. Reports he has applied for disability and been denied twice. 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 Parents don't understand his mental health, per patient. Financial / Lack of resources (include bankruptcy): Limited income, "I make $12 an hour," no insurance. Reports he and his GF have spent thousands of dollars lately. Housing / Lack of housing: Lives with his GF, loves the house and neighborhood. Says he isn't on the lease but he pays all the bills and its frustraiting. Physical health (include injuries & life threatening diseases): Ear infection and dental pain. Social relationships: Tumultous relationship with girlfriend, "she's bipolar too." Says he loves her and wants to marry her but he also wants to break up with her. He states she broke into his phone and she spies on him. Substance abuse: Smokes THC daily Bereavement / Loss: Has lost several friends to addiction. GF's friend died by suicide.  Living/Environment/Situation:  Living Arrangements: Spouse/significant other Living conditions (as described by patient or guardian): Rents a single family home in [place], two houses down from  his brother. Who else lives in the home?: GF, their baby How long has patient lived in current situation?: 3-4 years What is atmosphere in current home: Chaotic, Comfortable  Family History:  Marital status: Long term relationship Long term relationship, how long?: 10 years What types of issues is patient dealing with in the relationship?: Reports they both have bipolar disorder and have both experienced recent manic episodes Are you sexually active?: Yes What is your sexual orientation?: Straight Has your sexual activity been affected by drugs, alcohol, medication, or emotional stress?: Denies Does patient have children?: Yes How many children?: 2 How is patient's relationship with their children?: 50 year old son, good relationship but they don't talk or see eachother too often. 100 month old baby, close to baby  Childhood History:  By whom was/is the patient raised?: Both parents Additional childhood history information: Parents argued a lot and divorced when patient was 38 years old. Description of patient's relationship with caregiver when they were a child: Good Patient's description of current relationship with people who raised him/her: Strained. Reports his father can be smothering How were you disciplined when you got in trouble as a child/adolescent?: Appropriately, yelled at mostly Does patient have siblings?: Yes Number of Siblings: 3 Description of patient's current relationship with siblings: 1 older brother, 1 half brother, 1 step brother. Close to older brother, half brother is 76 and has autism and the patient wants to get to know his brother. Step brother lives in [place], good relationship. 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 How long has patient been employed?: 1.5 years Patient's job has been impacted by current illness: No What is the longest time patient has a held a job?: 6-7 years Where was the patient employed at that time?: [place] Did You Receive Any Psychiatric Treatment/Services While in Passenger transport manager?: No Are There Guns or Other Weapons in [place]?: No  Financial Resources:   Financial resources: Income from employment Does patient have a representative payee or guardian?: No  Alcohol/Substance Abuse:   What has been your use of drugs/alcohol within the last 12 months?: THC daily Alcohol/Substance Abuse Treatment Hx: Past Tx, Outpatient, Past Tx, Inpatient If yes, describe treatment: Prior [facility] in 2018. Prior [facility]. Reports current outpatient with [facility] Has alcohol/substance abuse ever caused legal problems?: Yes(Possession charge)  Social Support System:   Patient's Community Support System: Fair Describe Community Support System: GF, family, friends Type of faith/religion: "Spiritual, I'm talking to a lot of people about their religion." How does patient's faith help to cope with current illness?: Unsure  Leisure/Recreation:   Leisure and Hobbies: Conservation officer, historic buildings, board games  Strengths/Needs:   What is the patient's perception of their strengths?: "Keeping people safe."  Discharge Plan:   Currently receiving community mental health services: Yes (From Whom) Patient states concerns and preferences for aftercare planning are: Says he gets medication management from "Margarita Grizzle" at [facility]. Wants therapy with  Cam Hines at [facility] Patient states they will know when they are safe and ready for discharge when: Feels ready now, "I can handle being manic." Does patient have access to transportation?: Yes Does patient have financial barriers related to discharge medications?: Yes Patient description of barriers related to discharge medications: No insurance, limited income Will patient be returning to same living situation after discharge?: Yes  Summary/Recommendations:   Summary and Recommendations (to be completed by the evaluator): Rawn is a 38 year old male from [facility] he presents to [facility] under IVC intiated by his parents. Patient reports this is the third time he has presented to a hospital this week for mania. Patient reports his primary stressor is his relationship with his girlfriend, as she has bipolar disorder as well. Patient was previously hospitalized at [facility] in 2018. He would like to be referred to [facility]. Patient will benefit from crisis stabilization, medication management, therapeutic milieu, and referrals for services.  Joellen Jersey. 10/10/2018

## 2018-10-10 NOTE — Progress Notes (Signed)
DAR NOTE: Patient presents with anxious affect and mood.  Denies suicidal thoughts, auditory and visual hallucinations.  Described energy level as hyper and concentration as good.  Rates depression at 0, hopelessness at 0, and anxiety at 5.  Reports withdrawal symptoms of chilling, cravings, agitation and irritability on self inventory form.  Maintained on routine safety checks.  Medications given as prescribed.  Support and encouragement offered as needed.  Attended group and participated.  States goal for today is "teeth and wellness plan to get discharged."  Patient observed socializing with peers in the dayroom.  Requesting to see a dentist for his toothache.  Oral gel given as prescribed.  Patient reminded to schedule a dental appointment post discharge.  Patient is safe on and off the unit.

## 2018-10-10 NOTE — Progress Notes (Signed)
The patient is awake and is sitting on the floor in the hallway. The patient is refusing to go to bed despite being encouraged to do so by the secretary and this Chief Strategy Officer. He insists that he has been awake for 2-3 hours when in fact he has been asleep for 30 minutes.

## 2018-10-10 NOTE — Progress Notes (Addendum)
[facility] MD Progress Note  10/10/2018 12:48 PM Mitchell Mckee  MRN:  [id number] Subjective:  Patient reports partial improvement and states " I do feel like my mind has slowed down". Denies medication side effects other than mild sedation. Denies SI. Objective : I have discussed case with treatment team and have met with patient . 38 year old male, history of bipolar disorder.  Had recently presented to ED describing incipient symptoms of mania.  Improved with treatment, was discharged.  Returned to ED under IV commitment initiated by family due to disorganized, manic behaviors including threatening family, behaving aggressively.  Patient presents with pressured speech, disorganized thought process, expansive affect.  Has not been taking prescribed psychiatric medications.  Currently patient presents with partial improvement. Although still pressured at times, overall speech has improved and less rambling /less loud. Thought process is also tending to be more organized and linear today, although some tangentiality still noted . Tends to ruminate about relationship stressors.  Denies suicidal ideations, denies homicidal ideations. On unit has been generally pleasant, at times somewhat intrusive/ argumentative with a specific peer but redirectable by staff. Sleep remains poor.  Labs reviewed - lipid panel unremarkable except for low HDL, Plasma Glucose 94, HgbA1C 5.0, TSH 2.42  Principal Problem:  Bipolar Disorder, Manic Diagnosis: Bipolar Disorder , Manic  Total Time spent with patient: 20 minutes  Past Psychiatric History:   Past Medical History:  Past Medical History:  Diagnosis Date  . Bipolar 1 disorder ([place])    History reviewed. No pertinent surgical history. Family History: History reviewed. No pertinent family history. Family Psychiatric  History:  Social History:  Social History   Substance and Sexual Activity  Alcohol Use Yes  . Alcohol/week: 0.0 standard drinks   Comment: occ;  special occ     Social History   Substance and Sexual Activity  Drug Use Yes  . Types: Marijuana    Social History   Socioeconomic History  . Marital status: Single    Spouse name: Not on file  . Number of children: Not on file  . Years of education: Not on file  . Highest education level: Not on file  Occupational History  . Not on file  Social Needs  . Financial resource strain: Not on file  . Food insecurity    Worry: Not on file    Inability: Not on file  . Transportation needs    Medical: Not on file    Non-medical: Not on file  Tobacco Use  . Smoking status: Current Every Day Smoker    Packs/day: 2.00    Types: Cigarettes  . Smokeless tobacco: Never Used  Substance and Sexual Activity  . Alcohol use: Yes    Alcohol/week: 0.0 standard drinks    Comment: occ; special occ  . Drug use: Yes    Types: Marijuana  . Sexual activity: Yes    Birth control/protection: None  Lifestyle  . Physical activity    Days per week: Not on file    Minutes per session: Not on file  . Stress: Not on file  Relationships  . Social Herbalist on phone: Not on file    Gets together: Not on file    Attends religious service: Not on file    Active member of club or organization: Not on file    Attends meetings of clubs or organizations: Not on file    Relationship status: Not on file  Other Topics Concern  . Not on file  Social History Narrative  . Not on file   Additional Social History:   Sleep: Poor  Appetite:  Fair  Current Medications: Current Facility-Administered Medications  Medication Dose Route Frequency Provider Last Rate Last Dose  . ibuprofen (ADVIL) tablet 600 mg  600 mg Oral Q6H PRN Benton, Rashawnda L, RN   600 mg at 10/10/18 0800  . OLANZapine zydis (ZYPREXA) disintegrating tablet 5 mg  5 mg Oral Q8H PRN , Myer Peer, MD       And  . LORazepam (ATIVAN) tablet 1 mg  1 mg Oral PRN , Myer Peer, MD       And  . ziprasidone (GEODON)  injection 20 mg  20 mg Intramuscular PRN , Myer Peer, MD      . LORazepam (ATIVAN) tablet 1 mg  1 mg Oral Q8H PRN , Myer Peer, MD   1 mg at 10/10/18 0801  . nicotine (NICODERM CQ - dosed in mg/24 hours) patch 21 mg  21 mg Transdermal Daily , Myer Peer, MD   21 mg at 10/10/18 1157  . OLANZapine (ZYPREXA) tablet 10 mg  10 mg Oral QHS , Myer Peer, MD   10 mg at 10/09/18 2142  . [START ON 10/11/2018] OLANZapine (ZYPREXA) tablet 5 mg  5 mg Oral BH-q7a , Myer Peer, MD        Lab Results:  Results for orders placed or performed during the hospital encounter of 10/09/18 (from the past 48 hour(s))  Hemoglobin A1c     Status: None   Collection Time: 10/10/18  6:30 AM  Result Value Ref Range   Hgb A1c MFr Bld 5.0 4.8 - 5.6 %    Comment: (NOTE) Pre diabetes:          5.7%-6.4% Diabetes:              >6.4% Glycemic control for   <7.0% adults with diabetes    Mean Plasma Glucose 96.8 mg/dL    Comment: Performed at [facility], [place] [address], [place],  [address]  Lipid panel     Status: Abnormal   Collection Time: 10/10/18  6:30 AM  Result Value Ref Range   Cholesterol 103 0 - 200 mg/dL   Triglycerides 74 <150 mg/dL   HDL 34 (L) >40 mg/dL   Total CHOL/HDL Ratio 3.0 RATIO   VLDL 15 0 - 40 mg/dL   LDL Cholesterol 54 0 - 99 mg/dL    Comment:        Total Cholesterol/HDL:CHD Risk Coronary Heart Disease Risk Table                     Men   Women  1/2 Average Risk   3.4   3.3  Average Risk       5.0   4.4  2 X Average Risk   9.6   7.1  3 X Average Risk  23.4   11.0        Use the calculated Patient Ratio above and the CHD Risk Table to determine the patient's CHD Risk.        ATP III CLASSIFICATION (LDL):  <100     mg/dL   Optimal  100-129  mg/dL   Near or Above                    Optimal  130-159  mg/dL   Borderline  160-189  mg/dL   High  >190  mg/dL   Very High Performed at [facility] [address],  [place], [place] [address]   TSH     Status: None   Collection Time: 10/10/18  6:30 AM  Result Value Ref Range   TSH 2.427 0.350 - 4.500 uIU/mL    Comment: Performed by a 3rd Generation assay with a functional sensitivity of <=0.01 uIU/mL. Performed at [facility], [place] [address] [place], [place] [address]     Blood Alcohol level:  Lab Results  Component Value Date   ETH <10 10/08/2018   ETH <10 64/33/2951    Metabolic Disorder Labs: Lab Results  Component Value Date   HGBA1C 5.0 10/10/2018   MPG 96.8 10/10/2018   MPG 97 08/01/2016   No results found for: PROLACTIN Lab Results  Component Value Date   CHOL 103 10/10/2018   TRIG 74 10/10/2018   HDL 34 (L) 10/10/2018   CHOLHDL 3.0 10/10/2018   VLDL 15 10/10/2018   LDLCALC 54 10/10/2018   LDLCALC 53 08/01/2016    Physical Findings: AIMS: Facial and Oral Movements Muscles of Facial Expression: None, normal Lips and Perioral Area: None, normal Jaw: None, normal Tongue: None, normal,Extremity Movements Upper (arms, wrists, hands, fingers): None, normal Lower (legs, knees, ankles, toes): None, normal, Trunk Movements Neck, shoulders, hips: None, normal, Overall Severity Severity of abnormal movements (highest score from questions above): None, normal Incapacitation due to abnormal movements: None, normal Patient's awareness of abnormal movements (rate only patient's report): No Awareness, Dental Status Current problems with teeth and/or dentures?: Yes Does patient usually wear dentures?: No  CIWA:    COWS:     Musculoskeletal: Strength & Muscle Tone: within normal limits Gait & Station: normal Patient leans: N/A  Psychiatric Specialty Exam: Physical Exam  ROS denies chest pain , no shortness of breath, no nausea/no vomiting at this time  Blood pressure 96/80, pulse (!) 105, temperature 98.2 F (36.8 C), temperature source Oral, resp. rate 16, height '5\' 10"'$  (1.778 m), weight 65.8 kg, SpO2 100  %.Body mass index is 20.81 kg/m.  General Appearance: Fairly Groomed  Eye Contact:  Good  Speech:  less pressured   Volume:  Normal  Mood:  some improvement , less significantly/severely manic  Affect:  less expansive  Thought Process:  Better organized, but still tangential at times   Orientation:  Other:  fully alert and attentive  Thought Content:  no hallucinations, no delusions , not internally preoccupied   Suicidal Thoughts:  No denies suicidal or self injurious ideations, denies homicidal or violent ideations  Homicidal Thoughts:  No  Memory:  recent and remote grossly intact   Judgement:  Fair  Insight:  Fair  Psychomotor Activity:  no psychomotor agitation at this time  Concentration:  Concentration: Good and Attention Span: Good  Recall:  Good  Fund of Knowledge:  Good  Language:  Good  Akathisia:  Negative  Handed:  Right  AIMS (if indicated):     Assets:  Desire for Improvement Resilience  ADL's:  Intact  Cognition:  WNL  Sleep:  Number of Hours: 1.5   Assessment -  38 year old male, history of bipolar disorder.  Had recently presented to ED describing incipient symptoms of mania. Improved with treatment, was discharged.  Returned to ED under IV commitment initiated by family due to disorganized, manic behaviors including threatening family, behaving aggressively.  Patient presents with pressured speech, disorganized thought process, expansive affect.  Has not been taking prescribed psychiatric medications.  Today patient presents with partial improvement of manic symptoms, although still presents with some degree of pressured speech, elevated affect. Thought process noted more linear today. Denies SI or HI. Sleep remains poor . Thus far tolerating Zyprexa trial well- will titrate dose as tolerated. As noted, reports he does not want to restart Depakote due to having had side effects on this medication.    Treatment Plan Summary: Daily contact with patient to assess  and evaluate symptoms and progress in treatment, Medication management, Plan inpatient treatment and medications as below Encourage group and milieu participation  Encourage efforts to maintain abstinence Increase Zyprexa to 5 mgrs QAM and  mgrs QHS for mood disorder  Start Ativan 1 mgr BID for mood disorder,restlessness, insomnia Continue agitation protocol for agitation if needed. Treatment team working on disposition [place], MD 10/10/2018, 12:48 PM

## 2018-10-11 MED ORDER — OXCARBAZEPINE 150 MG PO TABS
150.0000 mg | ORAL_TABLET | Freq: Two times a day (BID) | ORAL | Status: DC
  Administered 2018-10-11 – 2018-10-12 (×3): 150 mg via ORAL
  Filled 2018-10-11 (×5): qty 1

## 2018-10-11 NOTE — [facility] Suicide Risk Assessment (Signed)
[place] INPATIENT:  Family/Significant Other Suicide Prevention Education  Suicide Prevention Education:  Education Completed;  girlfriend, Meyer Cory [phone]  has been identified by the patient as the family member/significant other with whom the patient will be residing, and identified as the person(s) who will aid the patient in the event of a mental health crisis (suicidal ideations/suicide attempt).  With written consent from the patient, the family member/significant other has been provided the following suicide prevention education, prior to the and/or following the discharge of the patient.  The suicide prevention education provided includes the following:  Suicide risk factors  Suicide prevention and interventions  National Suicide Hotline telephone number  [facility] assessment telephone number  [facility] Emergency Assistance [place] and/or Residential Mobile Crisis Unit telephone number  Request made of family/significant other to:  Remove weapons (e.g., guns, rifles, knives), all items previously/currently identified as safety concern.    Remove drugs/medications (over-the-counter, prescriptions, illicit drugs), all items previously/currently identified as a safety concern.  The family member/significant other verbalizes understanding of the suicide prevention education information provided.  The family member/significant other agrees to remove the items of safety concern listed above.  CSW spoke to patient's girlfriend, Meyer Cory, while en route to the [facility]. Denny Peon is taking out a restraining order (50-B). She states he is not allowed to return to their shared home at discharge.   She states she called him yesterday and said he couldn't come home and she threatened to change the locks. Patient reportedly responded by saying he would kick down the door to come in, and would take the baby.   Denny Peon shares the patient tends to  take "6-8 weeks" to recover from a manic episode. She feels he will not be appropriate and safe to return home and be around their child yet. She shares he has a history of non-compliance with medication and he has probably not taken medication as prescribed for about a year.   She shares that living with his mother or his father are options, she would like to be notified prior to discharge.  Joellen Jersey 10/11/2018, 11:13 AM

## 2018-10-11 NOTE — Progress Notes (Signed)
Adult Psychoeducational Group Note  Date:  10/11/2018 Time:  9:20 PM  Group Topic/Focus:  Wrap-Up Group:   The focus of this group is to help patients review their daily goal of treatment and discuss progress on daily workbooks.  Participation Level:  Active  Participation Quality:  Appropriate  Affect:  Appropriate  Cognitive:  Appropriate  Insight: Appropriate  Engagement in Group:  Engaged  Modes of Intervention:  Discussion  Additional Comments:  Patient attended group and participated.   Zilla Shartzer W Scout Gumbs [id number]/09/3901, 9:20 PM

## 2018-10-11 NOTE — Progress Notes (Signed)
D: Patient denies SI, HI or AVH. Patient presents as anxious, animated and intermittently intrusive.  Pt. Attended group and spoke about wanting to be a better father. He states his goal for tomorrow is to discharge or move to another hall.  Pt. With several physical complaints including dental pain, ear pain, constipation and sexual dysfunction related to his medication.   A: Patient given emotional support from RN. Patient encouraged to come to staff with concerns and/or questions. Patient's medication routine continued. Patient's orders and plan of care reviewed.   R: Patient remains appropriate and cooperative. Will continue to monitor patient q15 minutes for safety.

## 2018-10-11 NOTE — Progress Notes (Signed)
Patient ID: Mitchell Mckee, male   DOB: [birth year], 38 y.o.   MRN: [id number]  [name] NOVEL CORONAVIRUS (COVID-19) DAILY CHECK-OFF SYMPTOMS - answer yes or no to each - every day NO YES  Have you had a fever in the past 24 hours?  . Fever (Temp > 37.80C / 100F) X   Have you had any of these symptoms in the past 24 hours? . New Cough .  Sore Throat  .  Shortness of Breath .  Difficulty Breathing .  Unexplained Body Aches   X   Have you had any one of these symptoms in the past 24 hours not related to allergies?   . Runny Nose .  Nasal Congestion .  Sneezing   X   If you have had runny nose, nasal congestion, sneezing in the past 24 hours, has it worsened?  X   EXPOSURES - check yes or no X   Have you traveled outside the state in the past 14 days?  X   Have you been in contact with someone with a confirmed diagnosis of COVID-19 or PUI in the past 14 days without wearing appropriate PPE?  X   Have you been living in the same home as a person with confirmed diagnosis of COVID-19 or a PUI (household contact)?    X   Have you been diagnosed with COVID-19?    X              What to do next: Answered NO to all: Answered YES to anything:   Proceed with unit schedule Follow the [facility] Inpatient Flowsheet.

## 2018-10-11 NOTE — Progress Notes (Signed)
Patient ID: Mitchell Mckee, male   DOB: 1980/11/15, 38 y.o.   MRN: [id number] D: Patient appears irritable pacing up and down the hallway. When asked if there is anything that can be done pt replied "if you can kick my girlfriend's ass". Pt did not want to talk about the situation but agreed to take medication to calm down.    No acute distressed noted at this time.  A: Medications administered as prescribed. Support and encouragement provided.  R: Patient remains safe and complaint with medications.

## 2018-10-11 NOTE — [facility] Group Notes (Signed)
LCSW Aftercare Discharge Planning Group Note  10/11/2018  Type of Group and Topic: Psychoeducational Group: Discharge Planning  Participation Level: Active- Monopolizing  Description of Group  Discharge planning group reviews patient's anticipated discharge plans and assists patients to anticipate and address any barriers to wellness/recovery in the community. Suicide prevention education is reviewed with patients in group.  Therapeutic Goals  1. Patients will state their anticipated discharge plan and mental health aftercare  2. Patients will identify potential barriers to wellness in the community setting  3. Patients will engage in problem solving, solution focused discussion of ways to anticipate and address barriers to wellness/recovery  Summary of Patient Progress  Plan for Discharge/Comments: Patient identified working on the aspects of social and financial wellness into their recovery.  Supports: Ex-GF, friends  Therapeutic Modalities:  [place], [place]  10/11/2018 3:10 PM

## 2018-10-11 NOTE — Plan of Care (Signed)
  Problem: Activity: Goal: Interest or engagement in activities will improve Outcome: Progressing   Problem: Coping: Goal: Ability to verbalize frustrations and anger appropriately will improve Outcome: Progressing Goal: Ability to demonstrate self-control will improve Outcome: Not Progressing   Problem: Safety: Goal: Periods of time without injury will increase Outcome: Progressing

## 2018-10-11 NOTE — [facility] Group Notes (Signed)
[place] Group Notes:  (Nursing/MHT/Case Management/Adjunct)  Date:  10/11/2018  Time:  10:00 AM  Type of Therapy:  Nurse Education  Participation Level:  Active  Participation Quality:  Appropriate, Attentive, Monopolizing, Redirectable, Sharing and Supportive  Affect:  Anxious and Appropriate  Cognitive:  Alert and Appropriate  Insight:  Appropriate, Good and Improving  Engagement in Group:  Developing/Improving, Distracting, Engaged, Improving, Monopolizing, Off Topic and Supportive  Modes of Intervention:  Discussion, Education, Exploration, Socialization and Support  Summary of Progress/Problems: pt's discussed crisis management and how this related to past/current crises they were/are facing. Pt's shared their resources to help others realize coping skills, support systems and resources that could be utilized. Pt's heavily focused on results of Covid and how these had impacted their routine. Pt shared multiple times but had to be redirected when becoming tangential or recalling war stories. Pt was appropriate and had insight into their disease with crisis management now and hopefully going forward. Pt shared multiple stressors and what coping skills they have utilized/will utilize if crisis arises again.  Otelia Limes Tnya Ades 10/11/2018, 11:44 AM

## 2018-10-11 NOTE — Progress Notes (Addendum)
[facility] MD Progress Note  10/11/2018 10:19 AM DERONTE SOLIS  MRN:  [id number]   Subjective:  "I am doing great. I am not too irritable today."  Patient admitted to [facility] under involuntary commitment due disorganized thoughts, manic behaviors, agressive behavior, threatening family, and expansive affect.   Reports that he is not sleeping much, but states that he never sleeps much when he is at [facility]. States that he normally goes to sleep around 5 or 6 am when at home. States that he took a one hour nap last nights and woke up feeling refreshed . Discussed resuming Depakote, however, patient states that that Depakote caused his hair to fall out and that he experienced some sexual side effects so he preferred to try another medication. Discussed starting oxcarbazepine for mood stability. Discussed side effects to include hyponatremia and rash.   Today the patient is alert and oriented x4, pleasant, and cooperative. Speech is clear and coherent, less pressured. Manic symptoms appear to be improving. Thought process appears less disorganized and more linear. He denies suicidal thoughts, homicidal thoughts, and AVH.   Principal Problem:  Bipolar Disorder, Manic Diagnosis: Bipolar Disorder , Manic  Total Time spent with patient: 20 minutes  Past Psychiatric History: Patient reports history of Bipolar Disorder, which he states " I guess I have had for a long time but was diagnosed with  2 years ago". History of prior psychiatric admissions , most recently in 2018, for mania. At the time was discharged on Depakote, Hydroxyzine, and Risperidone . He reports history of psychotic symptoms/hallucinations during past manic episodes ( but not during current episode ) . Denies history of suicide attempts or of self injurious behaviors. Denies history of PTSD  Past Medical History:  Past Medical History:  Diagnosis Date  . Bipolar 1 disorder ([place])    History reviewed. No pertinent surgical history. Family  History: History reviewed. No pertinent family history. Family Psychiatric  History: Does not endorse family psychiatric history at this time Social History:  Social History   Substance and Sexual Activity  Alcohol Use Yes  . Alcohol/week: 0.0 standard drinks   Comment: occ; special occ     Social History   Substance and Sexual Activity  Drug Use Yes  . Types: Marijuana    Social History   Socioeconomic History  . Marital status: Single    Spouse name: Not on file  . Number of children: Not on file  . Years of education: Not on file  . Highest education level: Not on file  Occupational History  . Not on file  Social Needs  . Financial resource strain: Not on file  . Food insecurity    Worry: Not on file    Inability: Not on file  . Transportation needs    Medical: Not on file    Non-medical: Not on file  Tobacco Use  . Smoking status: Current Every Day Smoker    Packs/day: 2.00    Types: Cigarettes  . Smokeless tobacco: Never Used  Substance and Sexual Activity  . Alcohol use: Yes    Alcohol/week: 0.0 standard drinks    Comment: occ; special occ  . Drug use: Yes    Types: Marijuana  . Sexual activity: Yes    Birth control/protection: None  Lifestyle  . Physical activity    Days per week: Not on file    Minutes per session: Not on file  . Stress: Not on file  Relationships  . Social connections  Talks on phone: Not on file    Gets together: Not on file    Attends religious service: Not on file    Active member of club or organization: Not on file    Attends meetings of clubs or organizations: Not on file    Relationship status: Not on file  Other Topics Concern  . Not on file  Social History Narrative  . Not on file   Additional Social History:   Sleep: Poor  Appetite:  Fair  Current Medications: Current Facility-Administered Medications  Medication Dose Route Frequency Provider Last Rate Last Dose  . benzocaine (ORAJEL) 10 % mucosal gel    Mouth/Throat TID PRN Kenzo Ozment, Myer Peer, MD      . ibuprofen (ADVIL) tablet 600 mg  600 mg Oral Q6H PRN Benton, Rashawnda L, RN   600 mg at 10/11/18 0807  . LORazepam (ATIVAN) tablet 0.5 mg  0.5 mg Oral Q8H PRN Anber Mckiver, Myer Peer, MD   0.5 mg at 10/10/18 2225  . OLANZapine zydis (ZYPREXA) disintegrating tablet 5 mg  5 mg Oral Q8H PRN Divante Kotch, Myer Peer, MD       And  . LORazepam (ATIVAN) tablet 1 mg  1 mg Oral PRN Atticus Wedin, Myer Peer, MD       And  . ziprasidone (GEODON) injection 20 mg  20 mg Intramuscular PRN Obelia Bonello, Myer Peer, MD      . LORazepam (ATIVAN) tablet 1 mg  1 mg Oral BID Paden Senger, Myer Peer, MD   1 mg at 10/10/18 1642  . nicotine (NICODERM CQ - dosed in mg/24 hours) patch 21 mg  21 mg Transdermal Daily Marye Eagen, Myer Peer, MD   21 mg at 10/11/18 0809  . OLANZapine (ZYPREXA) tablet 10 mg  10 mg Oral QHS Korrine Sicard, Myer Peer, MD   10 mg at 10/10/18 2220  . OLANZapine (ZYPREXA) tablet 5 mg  5 mg Oral BH-q7a Whitt Auletta, Myer Peer, MD   5 mg at 10/11/18 0807  . OXcarbazepine (TRILEPTAL) tablet 150 mg  150 mg Oral BID Liviya Santini, Myer Peer, MD        Lab Results:  Results for orders placed or performed during the hospital encounter of 10/09/18 (from the past 48 hour(s))  Hemoglobin A1c     Status: None   Collection Time: 10/10/18  6:30 AM  Result Value Ref Range   Hgb A1c MFr Bld 5.0 4.8 - 5.6 %    Comment: (NOTE) Pre diabetes:          5.7%-6.4% Diabetes:              >6.4% Glycemic control for   <7.0% adults with diabetes    Mean Plasma Glucose 96.8 mg/dL    Comment: Performed at [facility], [place] [address], [place], [place] [address]  Lipid panel     Status: Abnormal   Collection Time: 10/10/18  6:30 AM  Result Value Ref Range   Cholesterol 103 0 - 200 mg/dL   Triglycerides 74 <150 mg/dL   HDL 34 (L) >40 mg/dL   Total CHOL/HDL Ratio 3.0 RATIO   VLDL 15 0 - 40 mg/dL   LDL Cholesterol 54 0 - 99 mg/dL    Comment:        Total Cholesterol/HDL:CHD Risk Coronary Heart Disease Risk  Table                     Men   Women  1/2 Average Risk   3.4  3.3  Average Risk       5.0   4.4  2 X Average Risk   9.6   7.1  3 X Average Risk  23.4   11.0        Use the calculated Patient Ratio above and the CHD Risk Table to determine the patient's CHD Risk.        ATP III CLASSIFICATION (LDL):  <100     mg/dL   Optimal  100-129  mg/dL   Near or Above                    Optimal  130-159  mg/dL   Borderline  160-189  mg/dL   High  >190     mg/dL   Very High Performed at [facility] [address] [place], [place] [address]   TSH     Status: None   Collection Time: 10/10/18  6:30 AM  Result Value Ref Range   TSH 2.427 0.350 - 4.500 uIU/mL    Comment: Performed by a 3rd Generation assay with a functional sensitivity of <=0.01 uIU/mL. Performed at [facility], [place] [address], [place], [place] [address]     Blood Alcohol level:  Lab Results  Component Value Date   ETH <10 10/08/2018   ETH <10 18/29/9371    Metabolic Disorder Labs: Lab Results  Component Value Date   HGBA1C 5.0 10/10/2018   MPG 96.8 10/10/2018   MPG 97 08/01/2016   No results found for: PROLACTIN Lab Results  Component Value Date   CHOL 103 10/10/2018   TRIG 74 10/10/2018   HDL 34 (L) 10/10/2018   CHOLHDL 3.0 10/10/2018   VLDL 15 10/10/2018   LDLCALC 54 10/10/2018   LDLCALC 53 08/01/2016    Physical Findings: AIMS: Facial and Oral Movements Muscles of Facial Expression: None, normal Lips and Perioral Area: None, normal Jaw: None, normal Tongue: None, normal,Extremity Movements Upper (arms, wrists, hands, fingers): None, normal Lower (legs, knees, ankles, toes): None, normal, Trunk Movements Neck, shoulders, hips: None, normal, Overall Severity Severity of abnormal movements (highest score from questions above): None, normal Incapacitation due to abnormal movements: None, normal Patient's awareness of abnormal movements (rate only patient's  report): No Awareness, Dental Status Current problems with teeth and/or dentures?: Yes Does patient usually wear dentures?: No  CIWA:    COWS:     Musculoskeletal: Strength & Muscle Tone: within normal limits Gait & Station: normal Patient leans: N/A  Psychiatric Specialty Exam: Physical Exam  Constitutional: He is oriented to person, place, and time. He appears well-developed and well-nourished. No distress.  Respiratory: Effort normal. No respiratory distress.  Musculoskeletal: Normal range of motion.  Neurological: He is alert and oriented to person, place, and time.  Skin: He is not diaphoretic.    Review of Systems  Constitutional: Negative for chills, diaphoresis, fever, malaise/fatigue and weight loss.  HENT:       Tooth Pain. Reports broken tooth.  Respiratory: Negative for cough and shortness of breath.   Cardiovascular: Negative for chest pain.  Gastrointestinal: Negative for diarrhea, nausea and vomiting.  Neurological: Negative for headaches.   denies chest pain , no shortness of breath, no nausea/no vomiting at this time  Blood pressure 115/89, pulse 96, temperature 98.2 F (36.8 C), temperature source Oral, resp. rate 16, height '5\' 10"'$  (1.778 m), weight 65.8 kg, SpO2 100 %.Body mass index is 20.81 kg/m.  General Appearance: Fairly Groomed  Eye Contact:  Good  Speech:  less pressured   Volume:  Normal  Mood:  some improvement , less significantly/severely manic  Affect:  less expansive  Thought Process:  Better organized, but still tangential at times   Orientation:  Other:  fully alert and attentive  Thought Content:  no hallucinations, no delusions , not internally preoccupied   Suicidal Thoughts:  No denies suicidal or self injurious ideations, denies homicidal or violent ideations  Homicidal Thoughts:  No  Memory:  recent and remote grossly intact   Judgement:  Fair  Insight:  Fair  Psychomotor Activity:  no psychomotor agitation at this time   Concentration:  Concentration: Good and Attention Span: Good  Recall:  Good  Fund of Knowledge:  Good  Language:  Good  Akathisia:  Negative  Handed:  Right  AIMS (if indicated):     Assets:  Desire for Improvement Resilience  ADL's:  Intact  Cognition:  WNL  Sleep:  Number of Hours: 0.75     Treatment Plan Summary: Daily contact with patient to assess and evaluate symptoms and progress in treatment, Medication management, Plan inpatient treatment and medications as below   Encourage group and milieu participation  Encourage efforts to maintain abstinence  Start oxcarbazepine 150 mg BID for mood disorder Continue Zyprexa  5 mg QAM and  mg QHS for mood disorder  Continue Ativan 1 mg BID for mood disorder,restlessness, insomnia Continue agitation protocol for agitation if needed.  Treatment team working on disposition planning options  Rozetta Nunnery, NP 10/11/2018, 10:19 AM   I have met patient along with Corene Cornea, NP and have reviewed case with treatment team.  Patient presents with partial but noticeable improvement.  On admission he had been quite expansive in affect and exhibiting pressured speech and psychomotor restlessness.  These dimensions of mania are all improving/better. Currently he is not allowed or pressured, presents less expansive although still vaguely irritable and presents calm , without agitation or restlessness, able to sit through session comfortably. Staff does report that patient continues to have episodes of being animated and is intermittently intrusive, requiring redirection. Currently behavior appears to be calm and in good control. Patient reports his family has wanted him to seek long-term inpatient care but states "right now I am feeling better I do not know that I need to be hospitalized for a long time".  We reviewed medication management-currently he is on Zyprexa and Ativan.  Tolerating well.  Denies side effects thus far.  Depakote had helped  clinically in the past but unfortunately was associated with alopecia and possibly sexual dysfunction due to which patient does not want to restart this medication.  He does express agreement/interest in adding Tegretol or Trileptal to regimen.  Would opt for the latter based on side effect profile.  Side effects reviewed, to include potential risk of hyponatremia.  (9/28 sodium serum level 139) Plan-continue Zyprexa 5 mg every morning and 10 mg nightly, Ativan 1 mg twice daily (would consider tapering down/off prior to discharge) , start Trileptal 150 mg twice daily initially.  He is also on Orajel for a partially chipped dental piece.  Treatment team working on disposition planning options.   Gabriel Earing , MD

## 2018-10-12 MED ORDER — OXCARBAZEPINE 300 MG PO TABS
300.0000 mg | ORAL_TABLET | Freq: Two times a day (BID) | ORAL | Status: DC
  Administered 2018-10-12 – 2018-10-15 (×6): 300 mg via ORAL
  Filled 2018-10-12 (×9): qty 1

## 2018-10-12 MED ORDER — HYDROCORTISONE 0.5 % EX CREA
TOPICAL_CREAM | Freq: Two times a day (BID) | CUTANEOUS | Status: DC
  Administered 2018-10-12 – 2018-10-17 (×7): via TOPICAL
  Filled 2018-10-12 (×2): qty 28.35

## 2018-10-12 MED ORDER — OLANZAPINE 10 MG PO TABS
10.0000 mg | ORAL_TABLET | Freq: Two times a day (BID) | ORAL | Status: DC
  Administered 2018-10-12 – 2018-10-16 (×8): 10 mg via ORAL
  Filled 2018-10-12 (×13): qty 1

## 2018-10-12 MED ORDER — TEMAZEPAM 15 MG PO CAPS
30.0000 mg | ORAL_CAPSULE | Freq: Every day | ORAL | Status: DC
  Administered 2018-10-12 – 2018-10-15 (×4): 30 mg via ORAL
  Filled 2018-10-12 (×4): qty 2

## 2018-10-12 NOTE — Progress Notes (Signed)
Recreation Therapy Notes  Date:  10.2.20 Time: 0930 Location: 300 Hall Dayroom  Group Topic: Stress Management  Goal Area(s) Addresses:  Patient will identify positive stress management techniques. Patient will identify benefits of using stress management post d/c.  Behavioral Response:  Engaged  Intervention: Stress Management  Activity : Progressive Muscle Relaxation.  LRT introduced the stress management technique of pmr.  LRT read a script that lead group in the process of tensing each muscle group individually and then relaxing them.  Education:  Stress Management, Discharge Planning.   Education Outcome: Acknowledges Education  Clinical Observations/Feedback:  Pt attended and participated in group session.    Tyreesha Maharaj, LRT/CTRS         Lewis Keats A 10/12/2018 11:50 AM 

## 2018-10-12 NOTE — Progress Notes (Signed)
Pt standing in the hallway. Pt walks closer to room 300 doorway to listen conversation with new pt Mitchell Mckee and [facility] . Shean has been asked several times today HIPP concern and privacy. Staff walks over to room #300 and close the door to allow  Staff to talk freely to [place] the pt. My remain in the hallway to try and listen to the conversation behind closed doors room 300. RN notify

## 2018-10-12 NOTE — Progress Notes (Signed)
Pt constantly coming to the nursing station through the night. Asking irrelevant questions , even after pt was informed that he could pace the hall, but needed to remain quiet as to not wake up other patients. Pt can be seen engaging with various staff through out the night , trying to find any little thing he can start a conversation with that has no relevance or importance at this time of the night.
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
## 2018-10-12 NOTE — Progress Notes (Signed)
[facility] MD Progress Note  10/12/2018 11:48 AM Mitchell Mckee  MRN:  [id number]   Subjective: Patient reports he feels he has improved compared to admission.  Denies suicidal ideations.  Denies medication side effects.  He continues to report dental/tongue pain associated with a partially chipped dental piece.  Objective: I have reviewed chart notes, have met with patient, have reviewed case with treatment team. 38 year old male, history of bipolar disorder.  Had recently presented to ED describing incipient symptoms of mania.  Improved with treatment, was discharged.  Returned to ED under IV commitment initiated by family due to disorganized, manic behaviors including threatening family, behaving aggressively.  Patient presents with pressured speech, disorganized thought process, expansive affect.  Has not been taking prescribed psychiatric medications.  Patient reports "I think I am doing better".  Currently denies medication side effects.  Denies SI. As reviewed with staff he continues to present with symptoms-sleep has been poor and he has been noted to be pacing the hall during the nighttime.  He has also been noted to be intrusive with other patients, approaching peers and staff frequently to initiate long conversations, flirtatious with females, having difficulty maintaining boundaries.  Has generally been redirectable without any threatening or overtly disruptive/violent behaviors. He does present with a vaguely irritable, dysphoric affect.  He no longer presents with pressured speech and his thought process appears more linear. Denies medication side effects other than reporting "I feel they slow me down". Of note, CSW reports that patient's girlfriend has informed staff that she is taking out a restraining order against patient and therefore patient is not allowed to return to their shared home at discharge.   Past Medical History:  Past Medical History:  Diagnosis Date  . Bipolar 1  disorder ([place])    History reviewed. No pertinent surgical history. Family History: History reviewed. No pertinent family history. Family Psychiatric  History: Does not endorse family psychiatric history at this time Social History:  Social History   Substance and Sexual Activity  Alcohol Use Yes  . Alcohol/week: 0.0 standard drinks   Comment: occ; special occ     Social History   Substance and Sexual Activity  Drug Use Yes  . Types: Marijuana    Social History   Socioeconomic History  . Marital status: Single    Spouse name: Not on file  . Number of children: Not on file  . Years of education: Not on file  . Highest education level: Not on file  Occupational History  . Not on file  Social Needs  . Financial resource strain: Not on file  . Food insecurity    Worry: Not on file    Inability: Not on file  . Transportation needs    Medical: Not on file    Non-medical: Not on file  Tobacco Use  . Smoking status: Current Every Day Smoker    Packs/day: 2.00    Types: Cigarettes  . Smokeless tobacco: Never Used  Substance and Sexual Activity  . Alcohol use: Yes    Alcohol/week: 0.0 standard drinks    Comment: occ; special occ  . Drug use: Yes    Types: Marijuana  . Sexual activity: Yes    Birth control/protection: None  Lifestyle  . Physical activity    Days per week: Not on file    Minutes per session: Not on file  . Stress: Not on file  Relationships  . Social connections    Talks on phone: Not on file  Gets together: Not on file    Attends religious service: Not on file    Active member of club or organization: Not on file    Attends meetings of clubs or organizations: Not on file    Relationship status: Not on file  Other Topics Concern  . Not on file  Social History Narrative  . Not on file   Additional Social History:   Sleep: Poor  Appetite:  Fair  Current Medications: Current Facility-Administered Medications  Medication Dose Route  Frequency Provider Last Rate Last Dose  . benzocaine (ORAJEL) 10 % mucosal gel   Mouth/Throat TID PRN Armaan Pond, Myer Peer, MD      . ibuprofen (ADVIL) tablet 600 mg  600 mg Oral Q6H PRN Benton, Rashawnda L, RN   600 mg at 10/12/18 0120  . LORazepam (ATIVAN) tablet 0.5 mg  0.5 mg Oral Q8H PRN Numan Zylstra, Myer [facility], MD   0.5 mg at 10/10/18 2225  . LORazepam (ATIVAN) tablet 1 mg  1 mg Oral BID Kamayah Pillay, Myer Peer, MD   1 mg at 10/12/18 0858  . nicotine (NICODERM CQ - dosed in mg/24 hours) patch 21 mg  21 mg Transdermal Daily Kriya Westra, Myer Peer, MD   21 mg at 10/12/18 0857  . OLANZapine (ZYPREXA) tablet 10 mg  10 mg Oral BID Johnn Hai, MD      . OLANZapine zydis [facility]) disintegrating tablet 5 mg  5 mg Oral Q8H PRN Dionta Larke, Myer Peer, MD       And  . ziprasidone (GEODON) injection 20 mg  20 mg Intramuscular PRN Mettie Roylance, Myer Peer, MD      . Oxcarbazepine (TRILEPTAL) tablet 300 mg  300 mg Oral BID Shanell Aden, Myer Peer, MD      . temazepam (RESTORIL) capsule 30 mg  30 mg Oral QHS Johnn Hai, MD        Lab Results:  No results found for this or any previous visit (from the past 58 hour(s)).  Blood Alcohol level:  Lab Results  Component Value Date   ETH <10 10/08/2018   ETH <10 16/10/9602    Metabolic Disorder Labs: Lab Results  Component Value Date   HGBA1C 5.0 10/10/2018   MPG 96.8 10/10/2018   MPG 97 08/01/2016   No results found for: PROLACTIN Lab Results  Component Value Date   CHOL 103 10/10/2018   TRIG 74 10/10/2018   HDL 34 (L) 10/10/2018   CHOLHDL 3.0 10/10/2018   VLDL 15 10/10/2018   LDLCALC 54 10/10/2018   LDLCALC 53 08/01/2016    Physical Findings: AIMS: Facial and Oral Movements Muscles of Facial Expression: None, normal Lips and Perioral Area: None, normal Jaw: None, normal Tongue: None, normal,Extremity Movements Upper (arms, wrists, hands, fingers): None, normal Lower (legs, knees, ankles, toes): None, normal, Trunk Movements Neck, shoulders, hips: None, normal,  Overall Severity Severity of abnormal movements (highest score from questions above): None, normal Incapacitation due to abnormal movements: None, normal Patient's awareness of abnormal movements (rate only patient's report): No Awareness, Dental Status Current problems with teeth and/or dentures?: Yes Does patient usually wear dentures?: No  CIWA:    COWS:     Musculoskeletal: Strength & Muscle Tone: within normal limits Gait & Station: normal Patient leans: N/A  Psychiatric Specialty Exam: Physical Exam  Constitutional: He is oriented to person, place, and time. He appears well-developed and well-nourished. No distress.  Respiratory: Effort normal. No respiratory distress.  Musculoskeletal: Normal range of motion.  Neurological: He is alert and  oriented to person, place, and time.  Skin: He is not diaphoretic.    Review of Systems  Constitutional: Negative for chills, diaphoresis, fever, malaise/fatigue and weight loss.  HENT:       Tooth Pain. Reports broken tooth.  Respiratory: Negative for cough and shortness of breath.   Cardiovascular: Negative for chest pain.  Gastrointestinal: Negative for diarrhea, nausea and vomiting.  Neurological: Negative for headaches.   denies chest pain , no shortness of breath, no nausea/no vomiting at this time  Blood pressure 105/79, pulse 97, temperature 98.2 F (36.8 C), temperature source Oral, resp. rate 16, height '5\' 10"'$  (1.778 m), weight 65.8 kg, SpO2 98 %.Body mass index is 20.81 kg/m.  General Appearance: Fairly Groomed  Eye Contact:  Good  Speech:  less pressured   Volume:  Normal  Mood:  Reports improvement, continues to exhibit manic behaviors  Affect:  Vaguely irritable/expansive  Thought Process:  Better organized, but still tangential at times   Orientation:  Other:  fully alert and attentive  Thought Content:  no hallucinations, no delusions , not internally preoccupied   Suicidal Thoughts:  No denies suicidal or self  injurious ideations, denies homicidal or violent ideations  Homicidal Thoughts:  No  Memory:  recent and remote grossly intact   Judgement:  Fair  Insight:  Fair  Psychomotor Activity:  Pacing unit at times  Concentration:  Concentration: Good and Attention Span: Good  Recall:  Good  Fund of Knowledge:  Good  Language:  Good  Akathisia:  Negative  Handed:  Right  AIMS (if indicated):     Assets:  Desire for Improvement Resilience  ADL's:  Intact  Cognition:  WNL  Sleep:  Number of Hours: 1   Assessment :  38 year old male, history of bipolar disorder.  Had recently presented to ED describing incipient symptoms of mania.  Improved with treatment, was discharged.  Returned to ED under IV commitment initiated by family due to disorganized, manic behaviors including threatening family, behaving aggressively.  Patient presents with pressured speech, disorganized thought process, expansive affect.  Has not been taking prescribed psychiatric medications.  Today patient presents with persistent symptoms of mania.  He does appear significantly less pressured and generally better organized in his thought process than on admission/less tangential.  Rather than jovial and expansive he currently presents vaguely irritable although at this time his behavior is in good control.  As reviewed with nursing staff he has presented with intrusive behaviors, flirtatious with females, requiring redirection from staff.  Sleep remains minimal. Thus far tolerating Zyprexa/Ativan/Trileptal ( which was recently added) well. Treatment Plan Summary: Daily contact with patient to assess and evaluate symptoms and progress in treatment, Medication management, Plan inpatient treatment and medications as below  Treatment plan reviewed as below today 10/2 Encourage group and milieu participation  Encourage efforts to maintain abstinence Increase Oxcarbazepine to 300 mg BID for mood disorder Continue Zyprexa  5 mg QAM and  10  mg QHS for mood disorder  Continue Ativan 1 mg BID for mood disorder,restlessness, insomnia Continue agitation protocol for agitation if needed. Treatment team working on disposition planning options Check BMP in a.m. to monitor sodium as recently started on oxcarbazepine.  *Based on behavioral issues on unit, staff has determined the patient is more appropriate for [place], MD 10/12/2018, 11:48 AM   Patient ID: Mitchell Mckee, male   DOB: May 31, 1980, 38 y.o.   MRN: [id number]

## 2018-10-12 NOTE — Progress Notes (Signed)
Dar Note: Patient continues to be disruptive, intrusive and sexually inappropriate towards male peers in milieu.  Patient redirected several times but continues with the behaviors.  MD made aware of behaviors.  Staff asked patient to move to a different unit per MD suggestion but patient refused.  MD made aware.

## 2018-10-12 NOTE — Progress Notes (Signed)
Pt asked staff why he can not carry the remote around with him inside his pocket as he did yesterday. Staff remind pt no pt supposed to carry remote inside his pocket. Pt are not allow to have the remote. The remote supposed to be kept at the nursing station. RN notify

## 2018-10-12 NOTE — Progress Notes (Signed)
Pt standing hallway talking, laughing. Pt keep asking when can he go inside dayroom. Pt talking on the phone. Pt hangs up phone. Pt said he need to go to bathroom. His roommate inside shower curtain close. Pt goes to his room to tell roommate take as long as he need to take inside the shower. Pt goes to room 305 to use bathroom. Pt comes out said he need to wash his hands. Staff advise pt roommate inside shower with curtain close he can use sink to wash hands inside bathroom. Pt said he want to come to nurse station to use hand santizer. Staff remind pt no pt are ne allow beyond arrow on floor. Pt said he does this all the time. Staff advise pt again he can not use or go near nurse station. RN notify

## 2018-10-12 NOTE — Progress Notes (Addendum)
Patient's ex-girlfriend, Meyer Cory [phone], called and left CSW a voicemail. The voicemail states that she is aware this patient has revoked consents for her to receive information from [facility], nursing, providers or other staff.  She stated that the patient called her several times last night (after he was aware the she took out a 50-B), and he made threats against her.  Patient previously threatened to kick down the door and take their 17 month old baby.  Ex-girlfriend wants to be informed when this patient discharges.  Case staffed with [place], [place].     Update 1:05pm  TOC Supervisor advised by legal department that [place] is able to inform ex-girlfriend of discharge, as this patient has communicated threats.  CSW spoke with Meyer Cory. Denny Peon states this patient has called her several times today as well. She states this patient threatened to return to their shared home at discharge and will petition an IVC in retaliation (this patient was IVC'd by his father).   Denny Peon shares that her 50-B was denied yesterday, but she has an appeal in court this Tuesday, 10/06 to appeal.  Stephanie Acre, LCSW-A Clinical Social Worker

## 2018-10-13 LAB — BASIC METABOLIC PANEL
Anion gap: 11 (ref 5–15)
BUN: 16 mg/dL (ref 6–20)
CO2: 19 mmol/L — ABNORMAL LOW (ref 22–32)
Calcium: 9.3 mg/dL (ref 8.9–10.3)
Chloride: 110 mmol/L (ref 98–111)
Creatinine, Ser: 0.89 mg/dL (ref 0.61–1.24)
GFR calc Af Amer: >60 mL/min (ref >60)
GFR calc non Af Amer: >60 mL/min (ref >60)
Glucose, Bld: 99 mg/dL (ref 70–99)
Potassium: 4.3 mmol/L (ref 3.5–5.1)
Sodium: 140 mmol/L (ref 135–145)

## 2018-10-13 MED ORDER — NEOMYCIN-POLYMYXIN-HC 1 % OT SOLN
4.0000 [drp] | Freq: Four times a day (QID) | OTIC | Status: DC
  Filled 2018-10-13 (×2): qty 10

## 2018-10-13 MED ORDER — INFLUENZA VAC SPLIT QUAD 0.5 ML IM SUSY: 0.5000 mL | PREFILLED_SYRINGE | INTRAMUSCULAR | Status: DC

## 2018-10-13 MED ORDER — INFLUENZA VAC SPLIT QUAD 0.5 ML IM SUSY
0.5000 mL | PREFILLED_SYRINGE | INTRAMUSCULAR | Status: AC
  Administered 2018-10-13: 08:00:00 0.5 mL via INTRAMUSCULAR
  Filled 2018-10-13: qty 0.5

## 2018-10-13 NOTE — Progress Notes (Signed)
Adult Psychoeducational Group Note  Date:  10/13/2018 Time:  12:04 AM  Group Topic/Focus:  Orientation:   The focus of this group is to educate the patient on the purpose and policies of crisis stabilization and provide a format to answer questions about their admission.  The group details unit policies and expectations of patients while admitted.  Participation Level:  Active  Participation Quality:  Appropriate  Affect:  Appropriate  Cognitive:  Appropriate  Insight: Appropriate  Engagement in Group:  Engaged  Modes of Intervention:  Education and Support  Additional Comments:  Patient attended and participated in group tonight.  Salley Scarlet [facility] 10/13/2018, 12:04 AM

## 2018-10-13 NOTE — B[facility] Group Notes (Signed)
LCSW Group Therapy Note  10/13/2018    10:00-11:00am   Type of Therapy and Topic:  Group Therapy: Shame and Its Impact   Participation Level:  Active   Description of Group:   In this group, patients shared and discussed that guilt is the negative feeling we have when we've done something wrong, while shame is the negative feeling we have simply about "being."  In listening to each other share, patients learned that humans are all imperfect and that there is no shame in this.  We discussed how it could positively impact our wellbeing by accepting our faults as part of our being that can be worked on but does not have to shame [place].    Therapeutic Goals: 1. Patients will learn the difference between guilt and shame. 2. Patients will share their current shame feelings and how this has impacted their current lives. 3. Patients will explore possible ways to think differently about those parts of their bodies, feelings, and lives about which they do have shame. 4. Patients will learn that shame is universal, and that keeping our shame a secret actually increases its hold on [place].  Summary of Patient Progress:  The patient shared that he feels shame about being here in the hospital fighting his mental health/substance abuse problems instead of being at home with his children.  This is of concern because he has an overall feeling of shame about his conditions.  He participated fully and expressed appreciation for other members' shares.  He did have to be asked to stop having side conversations.  Therapeutic Modalities:   Cognitive Behavioral Therapy Motivation Interviewing  Maretta Los  .

## 2018-10-13 NOTE — Progress Notes (Signed)
Pt presents with anxious mood and is observed to be fidgety and pacing on the unit. Pt is constantly at the nurses station and is intrusive. Pt redirected as needed by staff for inappropriate behaviors and poor boundaries on the unit. Pt denies SI/HI. Pt rates depression 0, hopelessness 0, and anxiety 4. Pt reports withdrawal symptoms of cravings, agitation, and irritability. Pt reports good sleep at bedtime. Pt reports having a good appetite today.  Medications reviewed with pt. V/s assessed. Verbal support provided. Pt encouraged to attend groups. 15 minute checks performed for safety.   Pt compliant with tx plan. No side effects to medications verbalized by pt.

## 2018-10-13 NOTE — Progress Notes (Signed)
[facility] MD Progress Note  10/13/2018 5:46 PM Mitchell Mckee  MRN:  [id number]   Subjective: Patient states "I guess I feel okay".  He states "I am definitely less manic".  Denies medication side effects at this time.  Denies suicidal ideations.  Objective: I have reviewed chart notes, have met with patient. 38 year old male, history of bipolar disorder.  Had recently presented to ED describing incipient symptoms of mania.  Improved with treatment, was discharged.  Returned to ED under IV commitment initiated by family due to disorganized, manic behaviors including threatening family, behaving aggressively.  Patient presents with pressured speech, disorganized thought process, expansive affect.  Has not been taking prescribed psychiatric medications.  Patient presents with partial improvement.  As per staff he does remain intrusive, having difficulty maintaining boundaries, requiring redirection.  He does, however, not present with pressured speech, affect is certainly less expansive and less irritable, and states he slept about  4 hours last night, compared to 1-2 the night before. Currently denies medication side effects. Denies suicidal ideations.  Of note, as per CSW note patient's girlfriend has reported she has obtained a 57 B against patient and that he will not be able to return home after discharge.  Patient denies any homicidal or violent ideations towards girlfriend or anyone else at this time. Labs reviewed- Na+ 140.   Past Medical History:  Past Medical History:  Diagnosis Date  . Bipolar 1 disorder ([place])    History reviewed. No pertinent surgical history. Family History: History reviewed. No pertinent family history. Family Psychiatric  History: Does not endorse family psychiatric history at this time Social History:  Social History   Substance and Sexual Activity  Alcohol Use Yes  . Alcohol/week: 0.0 standard drinks   Comment: occ; special occ     Social History   Substance  and Sexual Activity  Drug Use Yes  . Types: Marijuana    Social History   Socioeconomic History  . Marital status: Single    Spouse name: Not on file  . Number of children: Not on file  . Years of education: Not on file  . Highest education level: Not on file  Occupational History  . Not on file  Social Needs  . Financial resource strain: Not on file  . Food insecurity    Worry: Not on file    Inability: Not on file  . Transportation needs    Medical: Not on file    Non-medical: Not on file  Tobacco Use  . Smoking status: Current Every Day Smoker    Packs/day: 2.00    Types: Cigarettes  . Smokeless tobacco: Never Used  Substance and Sexual Activity  . Alcohol use: Yes    Alcohol/week: 0.0 standard drinks    Comment: occ; special occ  . Drug use: Yes    Types: Marijuana  . Sexual activity: Yes    Birth control/protection: None  Lifestyle  . Physical activity    Days per week: Not on file    Minutes per session: Not on file  . Stress: Not on file  Relationships  . Social Herbalist on phone: Not on file    Gets together: Not on file    Attends religious service: Not on file    Active member of club or organization: Not on file    Attends meetings of clubs or organizations: Not on file    Relationship status: Not on file  Other Topics Concern  . Not  on file  Social History Narrative  . Not on file   Additional Social History:   Sleep: Fair  Appetite:  Fair  Current Medications: Current Facility-Administered Medications  Medication Dose Route Frequency Provider Last Rate Last Dose  . benzocaine (ORAJEL) 10 % mucosal gel   Mouth/Throat TID PRN Cobos, Myer Peer, MD      . hydrocortisone cream 0.5 %   Topical BID Cobos, Fernando A, MD      . ibuprofen (ADVIL) tablet 600 mg  600 mg Oral Q6H PRN Benton, Rashawnda L, RN   600 mg at 10/13/18 1005  . LORazepam (ATIVAN) tablet 0.5 mg  0.5 mg Oral Q8H PRN Cobos, Myer Peer, MD   0.5 mg at 10/10/18 2225   . LORazepam (ATIVAN) tablet 1 mg  1 mg Oral BID Cobos, Myer Peer, MD   1 mg at 10/13/18 0817  . nicotine (NICODERM CQ - dosed in mg/24 hours) patch 21 mg  21 mg Transdermal Daily Cobos, Myer Peer, MD   21 mg at 10/13/18 0817  . OLANZapine (ZYPREXA) tablet 10 mg  10 mg Oral BID Johnn Hai, MD   10 mg at 10/13/18 0817  . OLANZapine zydis (ZYPREXA) disintegrating tablet 5 mg  5 mg Oral Q8H PRN Cobos, Myer Peer, MD       And  . ziprasidone (GEODON) injection 20 mg  20 mg Intramuscular PRN Cobos, Myer Peer, MD      . Oxcarbazepine (TRILEPTAL) tablet 300 mg  300 mg Oral BID Cobos, Myer Peer, MD   300 mg at 10/13/18 0817  . temazepam (RESTORIL) capsule 30 mg  30 mg Oral QHS Johnn Hai, MD   30 mg at 10/12/18 2240    Lab Results:  Results for orders placed or performed during the hospital encounter of 10/09/18 (from the past 48 hour(s))  Basic metabolic panel     Status: Abnormal   Collection Time: 10/13/18  6:49 AM  Result Value Ref Range   Sodium 140 135 - 145 mmol/L   Potassium 4.3 3.5 - 5.1 mmol/L   Chloride 110 98 - 111 mmol/L   CO2 19 (L) 22 - 32 mmol/L   Glucose, Bld 99 70 - 99 mg/dL   BUN 16 6 - 20 mg/dL   Creatinine, Ser 0.89 0.61 - 1.24 mg/dL   Calcium 9.3 8.9 - 10.3 mg/dL   GFR calc non Af Amer >60 >60 mL/min   GFR calc Af Amer >60 >60 mL/min   Anion gap 11 5 - 15    Comment: Performed at [facility], [place] [address] [place], [place] [address]    Blood Alcohol level:  Lab Results  Component Value Date   ETH <10 10/08/2018   ETH <10 50/53/9767    Metabolic Disorder Labs: Lab Results  Component Value Date   HGBA1C 5.0 10/10/2018   MPG 96.8 10/10/2018   MPG 97 08/01/2016   No results found for: PROLACTIN Lab Results  Component Value Date   CHOL 103 10/10/2018   TRIG 74 10/10/2018   HDL 34 (L) 10/10/2018   CHOLHDL 3.0 10/10/2018   VLDL 15 10/10/2018   LDLCALC 54 10/10/2018   LDLCALC 53 08/01/2016    Physical Findings: AIMS: Facial  and Oral Movements Muscles of Facial Expression: None, normal Lips and Perioral Area: None, normal Jaw: None, normal Tongue: None, normal,Extremity Movements Upper (arms, wrists, hands, fingers): None, normal Lower (legs, knees, ankles, toes): None, normal, Trunk Movements Neck, shoulders, hips: None, normal,  Overall Severity Severity of abnormal movements (highest score from questions above): None, normal Incapacitation due to abnormal movements: None, normal Patient's awareness of abnormal movements (rate only patient's report): No Awareness, Dental Status Current problems with teeth and/or dentures?: Yes Does patient usually wear dentures?: No  CIWA:    COWS:     Musculoskeletal: Strength & Muscle Tone: within normal limits Gait & Station: normal Patient leans: N/A  Psychiatric Specialty Exam: Physical Exam  Constitutional: He is oriented to person, place, and time. He appears well-developed and well-nourished. No distress.  Respiratory: Effort normal. No respiratory distress.  Musculoskeletal: Normal range of motion.  Neurological: He is alert and oriented to person, place, and time.  Skin: He is not diaphoretic.    Review of Systems  Constitutional: Negative for chills, diaphoresis, fever, malaise/fatigue and weight loss.  HENT:       Tooth Pain. Reports broken tooth.  Respiratory: Negative for cough and shortness of breath.   Cardiovascular: Negative for chest pain.  Gastrointestinal: Negative for diarrhea, nausea and vomiting.  Neurological: Negative for headaches.  Denies headache, no chest pain, no shortness of breath, no vomiting, no fever, no chills.  Today less focused on/does not report toothache, which he has been reporting  Blood pressure 106/83, pulse (!) 108, temperature 98 F (36.7 C), temperature source Oral, resp. rate 16, height '5\' 10"'$  (1.778 m), weight 65.8 kg, SpO2 98 %.Body mass index is 20.81 kg/m.  General Appearance: Fairly Groomed  Eye Contact:   Good  Speech:  Not pressured at this time not  Volume:  Normal  Mood:  States "I feel less manic"  Affect:  Appears less irritable and less expansive  Thought Process:  Better organized, but still tangential at times   Orientation:  Other:  fully alert and attentive  Thought Content:  no hallucinations, no delusions , not internally preoccupied   Suicidal Thoughts:  No denies suicidal or self injurious ideations, denies homicidal or violent ideations  Homicidal Thoughts:  No  Memory:  recent and remote grossly intact   Judgement:  Fair  Insight:  Fair  Psychomotor Activity:  Pacing unit at times-no psychomotor agitation at time of session  Concentration:  Concentration: Good and Attention Span: Good  Recall:  Good  Fund of Knowledge:  Good  Language:  Good  Akathisia:  Negative  Handed:  Right  AIMS (if indicated):     Assets:  Desire for Improvement Resilience  ADL's:  Intact  Cognition:  WNL  Sleep:  Number of Hours: 1.25   Assessment :  38 year old male, history of bipolar disorder.  Had recently presented to ED describing incipient symptoms of mania.  Improved with treatment, was discharged.  Returned to ED under IV commitment initiated by family due to disorganized, manic behaviors including threatening family, behaving aggressively.  Patient presents with pressured speech, disorganized thought process, expansive affect.  Has not been taking prescribed psychiatric medications.  Patient describes partial improvement.  He has insight regarding bipolar disorder diagnoses and current manic decompensation.  He states he feels "less manic" and describes improved racing thoughts.  He does present less irritable/less expansive without pressured speech and slept partially better last night.  He does, however, continue to present with intrusive behaviors/boundary issues requiring staff redirection. Treatment Plan Summary: Daily contact with patient to assess and evaluate symptoms and  progress in treatment, Medication management, Plan inpatient treatment and medications as below  Treatment plan reviewed as below today 10/3 Encourage group and milieu participation  Encourage efforts to maintain abstinence Continue Oxcarbazepine  300 mg BID for mood disorder Continue Zyprexa  10 mgrs BID  for mood disorder  Continue Ativan 1 mg BID for mood disorder,restlessness, insomnia Continue agitation protocol for agitation if needed. Treatment team working on disposition planning options.    Jenne Campus, MD 10/13/2018, 5:46 PM   Patient ID: Paulita Fujita, male   DOB: [birth year], 38 y.o.   MRN: [id number]

## 2018-10-13 NOTE — [facility] Group Notes (Signed)
[place] Group Notes:  (Nursing/MHT/Case Management/Adjunct)  Date:  10/13/2018  Time:  1400  Type of Therapy:  Nurse Education  Participation Level:  Active  Participation Quality:  Appropriate  Affect:  Appropriate  Cognitive:  Appropriate  Insight:  Appropriate  Engagement in Group:  Engaged  Modes of Intervention:  Discussion and Education  Summary of Progress/Problems:  Marissa Calamity 10/13/2018, 2:43 PM

## 2018-10-13 NOTE — Progress Notes (Signed)
Writer has observed patient up in the dayroom with minimal interaction with peers. He has been calm and cooperative tonight.Writer spoke with him about his scheduled medication. Safety maintained on unit with 15 min checks

## 2018-10-14 MED ORDER — NEOMYCIN-POLYMYXIN-HC 3.5-10000-1 OT SUSP
4.0000 [drp] | Freq: Four times a day (QID) | OTIC | Status: DC
  Administered 2018-10-14 – 2018-10-17 (×10): 4 [drp] via OTIC
  Filled 2018-10-14: qty 10

## 2018-10-14 NOTE — Progress Notes (Signed)
Staff advise pt the visitor can not be in the bathrroom with the pt. RN notify

## 2018-10-14 NOTE — Tx Team (Deleted)
Initial Treatment Plan 10/14/2018 11:37 PM [name] Monie FXT:[id number]    PATIENT STRESSORS: Financial difficulties Marital or family conflict   PATIENT STRENGTHS: Active sense of humor Average or above average intelligence Communication skills   PATIENT IDENTIFIED PROBLEMS: Anger management     "I want to be discharged"                 DISCHARGE CRITERIA:  Adequate post-discharge living arrangements Improved stabilization in mood, thinking, and/or behavior Need for constant or close observation no longer present  PRELIMINARY DISCHARGE PLAN: Participate in family therapy Return to previous living arrangement  PATIENT/FAMILY INVOLVEMENT: This treatment plan has been presented to and reviewed with the patient, Mitchell Mckee.  The patient and family have been given the opportunity to ask questions and make suggestions.  Margaretann Loveless, RN 10/14/2018, 11:37 PM

## 2018-10-14 NOTE — Progress Notes (Signed)
[facility] MD Progress Note  10/14/2018 10:39 AM Mitchell Mckee  MRN:  [id number]   Subjective: Patient reports feeling better and states "I feel like I have done a 180".  Reports he does not feel he has racing thoughts any longer and states he got about 4 to 5 hours of sleep last night. Denies SI.   Objective: I have reviewed chart notes, have met with patient. 38 year old male, history of bipolar disorder.  Had recently presented to ED describing incipient symptoms of mania.  Improved with treatment, was discharged.  Returned to ED under IV commitment initiated by family due to disorganized, manic behaviors including threatening family, behaving aggressively.  Patient presents with pressured speech, disorganized thought process, expansive affect.  Has not been taking prescribed psychiatric medications.  With writer patient presents pleasant, generally calm, without pressured speech.  As above, currently reports improved mood and describes noticeably improving sleep, although still only sleeping a few hours per night. Staff reports patient has continued to present with some irritability, affective lability, intrusive behaviors, such as "checking in on" other patients, going into other patient's rooms, having difficulty maintaining boundaries.  Patient's insight into this issue seems limited/fair.  States" I am just trying to help them, they are my friends". He does appear significantly calmer than he did on admission, and at this time his speech is not pressured ,his thought process is more linear, and although slightly irritable generally his affect is improved/less labile. Denies medication side effects.  Denies suicidal ideations. We reviewed disposition planning-of note, GF had reported to staff that she was attending a 20 B against patient and that he would be unable to return home at discharge.  Patient currently states "we have talked since then, things are all right"  Past Medical History:  Past  Medical History:  Diagnosis Date  . Bipolar 1 disorder ([place])    History reviewed. No pertinent surgical history. Family History: History reviewed. No pertinent family history. Family Psychiatric  History: Does not endorse family psychiatric history at this time Social History:  Social History   Substance and Sexual Activity  Alcohol Use Yes  . Alcohol/week: 0.0 standard drinks   Comment: occ; special occ     Social History   Substance and Sexual Activity  Drug Use Yes  . Types: Marijuana    Social History   Socioeconomic History  . Marital status: Single    Spouse name: Not on file  . Number of children: Not on file  . Years of education: Not on file  . Highest education level: Not on file  Occupational History  . Not on file  Social Needs  . Financial resource strain: Not on file  . Food insecurity    Worry: Not on file    Inability: Not on file  . Transportation needs    Medical: Not on file    Non-medical: Not on file  Tobacco Use  . Smoking status: Current Every Day Smoker    Packs/day: 2.00    Types: Cigarettes  . Smokeless tobacco: Never Used  Substance and Sexual Activity  . Alcohol use: Yes    Alcohol/week: 0.0 standard drinks    Comment: occ; special occ  . Drug use: Yes    Types: Marijuana  . Sexual activity: Yes    Birth control/protection: None  Lifestyle  . Physical activity    Days per week: Not on file    Minutes per session: Not on file  . Stress: Not on file  Relationships  . Social Herbalist on phone: Not on file    Gets together: Not on file    Attends religious service: Not on file    Active member of club or organization: Not on file    Attends meetings of clubs or organizations: Not on file    Relationship status: Not on file  Other Topics Concern  . Not on file  Social History Narrative  . Not on file   Additional Social History:   Sleep: Fair  Appetite:  Good  Current Medications: Current  Facility-Administered Medications  Medication Dose Route Frequency Provider Last Rate Last Dose  . benzocaine (ORAJEL) 10 % mucosal gel   Mouth/Throat TID PRN Marisa Hage, Myer Peer, MD      . hydrocortisone cream 0.5 %   Topical BID Daud Cayer A, MD      . ibuprofen (ADVIL) tablet 600 mg  600 mg Oral Q6H PRN Benton, Rashawnda L, RN   600 mg at 10/14/18 0831  . LORazepam (ATIVAN) tablet 0.5 mg  0.5 mg Oral Q8H PRN Rehana Uncapher, Myer Peer, MD   0.5 mg at 10/14/18 0340  . LORazepam (ATIVAN) tablet 1 mg  1 mg Oral BID Kamareon Sciandra, Myer Peer, MD   1 mg at 10/14/18 3825  . neomycin-polymyxin-hydrocortisone (CORTISPORIN) OTIC (EAR) suspension 4 drop  4 drop Right EAR Q6H Jessamine Barcia, Myer Peer, MD   4 drop at 10/14/18 0342  . nicotine (NICODERM CQ - dosed in mg/24 hours) patch 21 mg  21 mg Transdermal Daily Bronnie Vasseur, Myer Peer, MD   21 mg at 10/14/18 0836  . OLANZapine (ZYPREXA) tablet 10 mg  10 mg Oral BID Johnn Hai, MD   10 mg at 10/14/18 0831  . OLANZapine zydis (ZYPREXA) disintegrating tablet 5 mg  5 mg Oral Q8H PRN Glennice Marcos, Myer Peer, MD       And  . ziprasidone (GEODON) injection 20 mg  20 mg Intramuscular PRN Meshia Rau, Myer Peer, MD      . Oxcarbazepine (TRILEPTAL) tablet 300 mg  300 mg Oral BID Thailyn Khalid, Myer Peer, MD   300 mg at 10/14/18 0831  . temazepam (RESTORIL) capsule 30 mg  30 mg Oral QHS Johnn Hai, MD   30 mg at 10/13/18 2136    Lab Results:  Results for orders placed or performed during the hospital encounter of 10/09/18 (from the past 48 hour(s))  Basic metabolic panel     Status: Abnormal   Collection Time: 10/13/18  6:49 AM  Result Value Ref Range   Sodium 140 135 - 145 mmol/L   Potassium 4.3 3.5 - 5.1 mmol/L   Chloride 110 98 - 111 mmol/L   CO2 19 (L) 22 - 32 mmol/L   Glucose, Bld 99 70 - 99 mg/dL   BUN 16 6 - 20 mg/dL   Creatinine, Ser 0.89 0.61 - 1.24 mg/dL   Calcium 9.3 8.9 - 10.3 mg/dL   GFR calc non Af Amer >60 >60 mL/min   GFR calc Af Amer >60 >60 mL/min   Anion gap 11 5 - 15     Comment: Performed at [facility], [place] [address], [place], [place] [address]    Blood Alcohol level:  Lab Results  Component Value Date   [facility] <10 10/08/2018   ETH <10 67/34/1937    Metabolic Disorder Labs: Lab Results  Component Value Date   HGBA1C 5.0 10/10/2018   MPG 96.8 10/10/2018   MPG 97 08/01/2016   No  results found for: PROLACTIN Lab Results  Component Value Date   CHOL 103 10/10/2018   TRIG 74 10/10/2018   HDL 34 (L) 10/10/2018   CHOLHDL 3.0 10/10/2018   VLDL 15 10/10/2018   LDLCALC 54 10/10/2018   LDLCALC 53 08/01/2016    Physical Findings: AIMS: Facial and Oral Movements Muscles of Facial Expression: None, normal Lips and Perioral Area: None, normal Jaw: None, normal Tongue: None, normal,Extremity Movements Upper (arms, wrists, hands, fingers): None, normal Lower (legs, knees, ankles, toes): None, normal, Trunk Movements Neck, shoulders, hips: None, normal, Overall Severity Severity of abnormal movements (highest score from questions above): None, normal Incapacitation due to abnormal movements: None, normal Patient's awareness of abnormal movements (rate only patient's report): No Awareness, Dental Status Current problems with teeth and/or dentures?: Yes Does patient usually wear dentures?: No  CIWA:    COWS:     Musculoskeletal: Strength & Muscle Tone: within normal limits Gait & Station: normal Patient leans: N/A  Psychiatric Specialty Exam: Physical Exam  Constitutional: He is oriented to person, place, and time. He appears well-developed and well-nourished. No distress.  Respiratory: Effort normal. No respiratory distress.  Musculoskeletal: Normal range of motion.  Neurological: He is alert and oriented to person, place, and time.  Skin: He is not diaphoretic.    Review of Systems  Constitutional: Negative for chills, diaphoresis, fever, malaise/fatigue and weight loss.  HENT:       Tooth Pain. Reports broken tooth.   Respiratory: Negative for cough and shortness of breath.   Cardiovascular: Negative for chest pain.  Gastrointestinal: Negative for diarrhea, nausea and vomiting.  Neurological: Negative for headaches.  No headache, no chest pain, no shortness of breath, no vomiting, no fever, no chills.  Describes toothache  Blood pressure 110/87, pulse (!) 102, temperature 98.2 F (36.8 C), temperature source Oral, resp. rate 16, height '5\' 10"'$  (1.778 m), weight 65.8 kg, SpO2 98 %.Body mass index is 20.81 kg/m.  General Appearance: Fairly Groomed  Eye Contact:  Good  Speech:  Normal Rate  Volume:  Normal  Mood:  Reports improvement and currently states his mood is "normal"  Affect:  Less expansive, less irritable, but still labile  Thought Process:  Better organized, but still tangential at times   Orientation:  Other:  fully alert and attentive  Thought Content:  no hallucinations, no delusions , not internally preoccupied   Suicidal Thoughts:  No denies suicidal or self injurious ideations, denies homicidal or violent ideations  Homicidal Thoughts:  No  Memory:  recent and remote grossly intact   Judgement:  Fair  Insight:  Fair  Psychomotor Activity: No current psychomotor restlessness or agitation.  At times pacing on unit  Concentration:  Concentration: Improving compared to admission and Attention Span: Improving compared to admission  Recall:  Good  Fund of Knowledge:  Good  Language:  Good  Akathisia:  Negative  Handed:  Right  AIMS (if indicated):     Assets:  Desire for Improvement Resilience  ADL's:  Intact  Cognition:  WNL  Sleep:  Number of Hours: 2.75   Assessment :  38 year old male, history of bipolar disorder.  Had recently presented to ED describing incipient symptoms of mania.  Improved with treatment, was discharged.  Returned to ED under IV commitment initiated by family due to disorganized, manic behaviors including threatening family, behaving aggressively.  Patient  presents with pressured speech, disorganized thought process, expansive affect.  Has not been taking prescribed psychiatric medications.  Patient is presenting  with gradual/partial improvement compared to admission.  He is noted to be better organized in his thought process, no longer pressured in speech, with partially improved insomnia, although still sleeping only a few hours per night.  He does continue to present with difficulty maintaining boundaries and intrusiveness.  Overall tolerating Trileptal , Ativan,and Zyprexa well. Na + serum level within normal at 140. Treatment Plan Summary: Daily contact with patient to assess and evaluate symptoms and progress in treatment, Medication management, Plan inpatient treatment and medications as below  Treatment plan reviewed as below today 10/3 Encourage group and milieu participation  Encourage efforts to maintain abstinence Continue Oxcarbazepine  300 mg BID for mood disorder Continue Zyprexa  10 mgrs BID  for mood disorder  Continue Ativan 1 mg BID for mood disorder,restlessness, insomnia Continue agitation protocol for agitation if needed. Treatment team working on disposition planning options. If patient provides consent , treatment team to contact GF to clarify if she has a 50 B against patient , as this would likely mean patient cannot return home after discharge and would affect discharge planning     Jenne Campus, MD 10/14/2018, 10:39 AM   Patient ID: Mitchell Mckee, male   DOB: 1980-06-03, 38 y.o.   MRN: [id number] Patient ID: Mitchell Mckee, male   DOB: 1980/09/26, 38 y.o.   MRN: [id number]

## 2018-10-14 NOTE — [facility] Group Notes (Addendum)
[facility] LCSW Group Therapy Note  10/14/2018  10:00-11:00AM  Type of Therapy and Topic:  Group Therapy:  Adding Supports Including Yourself  Participation Level:  Active   Description of Group:  Patients in this group were introduced to the concept that additional supports including self-support are an essential part of recovery.  Patients listed what supports they believe they need to add to their lives to achieve their goals at discharge, and they listed such things as therapist, family, doctor, support groups, and more.   A song entitled "My Own Hero" was played and a group discussion ensued in which patients stated they could relate to the song and it inspired them to realize they have be willing to help themselves in order to succeed, because other people cannot achieve their goals such as sobriety or stability for them.  A song was played called "I Am Enough" which led to a discussion about being willing to believe we are worth the effort of being a self-support.   Therapeutic Goals: 1)  demonstrate the importance of being a key part of one's own support system 2)  discuss various available supports 3)  encourage patient to use music as part of their self-support and focus on goals 4)  elicit ideas from patients about supports that need to be added   Summary of Patient Progress:  The patient expressed that his family cares about him and his children love him unconditionally.  He stated his children do not know that he has these drugs problems.  He was angry with his family, however, stating that he can call them and ask for help on something small such as getting his stereo operational, but when they come to help, they try to fix all the big things that he wasn't asking for their help with.  We brainstormed how he can approach them when he is not angry and tell them what he truly means when he asks for help with one specific task.  He was able to see how a different approach might create a different  result.  Later in group when a male patient was crying in reaction to a song, he tried to console and kept getting closer and closer to her.  Others in the group talked about letting people cry and get their emotions out, but he lacked insight that they were referring to him.  He continued to encroach on her space, but did not actually touch her.  Therapeutic Modalities:   Motivational Interviewing Activity  Maretta Los

## 2018-10-14 NOTE — Progress Notes (Signed)
D.  Pt anxious on approach, requested medication for this.  Pt encouraged to take HS Restoril, explained it will help with night time anxiety.  At beginning of shift, MHT found patient standing inside doorway of room 300 speaking with peers.  Pt reminded that he is not to be in peer's room.  Pt apologized, but does have issues with intrusive behavior.  Pt appeared upset about phone call earlier.  Pt did not wish to discuss the nature of this.  Pt denies SI/HI/AVH at this time.  A.  Support and encouragement offered, medication given as ordered  R. Pt remains safe on the unit, will continue to monitor.

## 2018-10-14 NOTE — Progress Notes (Signed)
Pt presents with a flat affect and a labile mood. Pt irritable on approach and remains intrusive, needy and attention seeking. The pt is constantly at the nurses station seeking attention. Pt rates depression 0 and anxiety 2/10. Pt denies SI/HI. Pt reports sleeping 6 hours last night but per the night shift nurse, the pt slept 2-3 hours.   Medications reviewed with pt. V/s assessed. Pt redirected by staff as needed for inappropriate and intrusive behaviors. Verbal support provided. Pt encouraged to attend groups. 15 minute checks performed for safety.  Pt compliant with tx plan. No side effects to medications verbalized by the pt.

## 2018-10-14 NOTE — Progress Notes (Signed)
Staff adv pt he can not show visitors around on the unit, Visitation is only inside the pt's room. RN notify
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
## 2018-10-14 NOTE — Plan of Care (Signed)
  Problem: Activity: Goal: Interest or engagement in activities will improve Outcome: Progressing Goal: Sleeping patterns will improve Outcome: Not Progressing   Problem: Health Behavior/Discharge Planning: Goal: Compliance with treatment plan for underlying cause of condition will improve Outcome: Progressing   Problem: Safety: Goal: Periods of time without injury will increase Outcome: Progressing   Problem: Coping: Goal: Ability to identify and develop effective coping behavior will improve Outcome: Progressing   Problem: Self-Concept: Goal: Ability to identify factors that promote anxiety will improve Outcome: Progressing

## 2018-10-14 NOTE — Progress Notes (Addendum)
Pt continues to try to go into the room's of peers, MHT saw him at beginning of shift go into room 300, and a male peer stated this morning that he believed patient had walked into his room early this morning.  Male peer was feeling unwell this morning and went to room to lay down and patient stated "I will check on her later".  This staff member reminded patient that he is not to go in peer's rooms and patient stated "yeah I know you all have been telling me every day, but I'm going to check on my friends"  Pt states "we will scrap if I can't do that".  Again tried to impress upon patient why he is not to do this, but patient appeared very irritable about the subject.

## 2018-10-15 DIAGNOSIS — F319 Bipolar disorder, unspecified: Secondary | ICD-10-CM | POA: Diagnosis present

## 2018-10-15 MED ORDER — OXCARBAZEPINE 300 MG PO TABS
300.0000 mg | ORAL_TABLET | ORAL | Status: DC
  Administered 2018-10-16 – 2018-10-17 (×2): 300 mg via ORAL
  Filled 2018-10-15 (×3): qty 1

## 2018-10-15 MED ORDER — OXCARBAZEPINE 300 MG PO TABS
600.0000 mg | ORAL_TABLET | Freq: Every day | ORAL | Status: DC
  Administered 2018-10-15 – 2018-10-16 (×2): 600 mg via ORAL
  Filled 2018-10-15 (×3): qty 2

## 2018-10-15 MED ORDER — LORAZEPAM 1 MG PO TABS
1.0000 mg | ORAL_TABLET | Freq: Three times a day (TID) | ORAL | Status: DC | PRN
  Administered 2018-10-16: 1 mg via ORAL
  Filled 2018-10-15: qty 1

## 2018-10-15 MED ORDER — CLOBETASOL PROPIONATE 0.05 % EX CREA
TOPICAL_CREAM | Freq: Two times a day (BID) | CUTANEOUS | Status: DC
  Administered 2018-10-15 (×2): via TOPICAL
  Filled 2018-10-15: qty 15

## 2018-10-15 NOTE — Progress Notes (Signed)
Patient rated his day as a 9 out of a possible 10 since he had "fun" today. He also stated that he had a good visit with his male visitor this evening. His goal for tomorrow is to get discharged.

## 2018-10-15 NOTE — Progress Notes (Signed)
Adult Psychoeducational Group Note  Date:  10/15/2018 Time:  4:05 AM   Group Topic/Focus   :      Wrap-Up Group:   The focus of this group is to help patients review their daily goal of treatment and discuss progress on daily workbooks.     Participation Level:  Active  Participation Quality:  Appropriate  Affect:  Appropriate  Cognitive:  Appropriate  Insight: None  Engagement in Group:  Engaged  Modes of Intervention:  Discussion  Additional Comments:  Pt said his day was a 6. His goal for today go home. He needs to go to the dentist. His ears really hurts him.Lenice Llamas Long 10/15/2018, 4:05 AM

## 2018-10-15 NOTE — [facility] Group Notes (Signed)
LCSW Group Therapy Note 10/15/2018 2:49 PM  Type of Therapy and Topic: Group Therapy: Overcoming Obstacles  Participation Level: Active  Description of Group:  In this group patients will be encouraged to explore what they see as obstacles to their own wellness and recovery. They will be guided to discuss their thoughts, feelings, and behaviors related to these obstacles. The group will process together ways to cope with barriers, with attention given to specific choices patients can make. Each patient will be challenged to identify changes they are motivated to make in order to overcome their obstacles. This group will be process-oriented, with patients participating in exploration of their own experiences as well as giving and receiving support and challenge from other group members.  Therapeutic Goals: 1. Patient will identify personal and current obstacles as they relate to admission. 2. Patient will identify barriers that currently interfere with their wellness or overcoming obstacles.  3. Patient will identify feelings, thought process and behaviors related to these barriers. 4. Patient will identify two changes they are willing to make to overcome these obstacles:   Summary of Patient Progress  Mitchell Mckee was engaged and participated throughout the group's session. Mitchell Mckee reports that his main obstacle is "mending my family stuff". Mitchell Mckee states that he may have strained some of his family relationships and that he plans to mend those relationships when he discharges from the hospital.    Therapeutic Modalities:  Cognitive Behavioral Therapy Solution Focused Therapy Motivational Interviewing Relapse Prevention Therapy   [place] Social Worker

## 2018-10-15 NOTE — Progress Notes (Signed)
Pt went to bed around 2200 and got up around 2330.  Has appeared to not have slept at all since that time.  Pt refused offered Ativan.  Presently sitting at end of his hall next to the door of his room.

## 2018-10-15 NOTE — Progress Notes (Signed)
[facility] MD Progress Note  10/15/2018 11:54 AM Mitchell Mckee  MRN:  [id number]   Subjective: Patient continues to report that his mood is improved.  He expresses insight into Bipolar Disorder and having a manic episode. States " I definitely know I have it and I need to take medication to control it", but reports " I think I have slowed down a whole lot".  Denies medication side effects.  States his GF came to visit him last night and that visit went well . Denies SI   Objective: I have reviewed case with treatment team, have met with patient. 38 year old male, history of bipolar disorder.  Had recently presented to ED describing incipient symptoms of mania.  Improved with treatment, was discharged.  Returned to ED under IV commitment initiated by family due to disorganized, manic behaviors including threatening family, behaving aggressively.  Patient presents with pressured speech, disorganized thought process, expansive affect.  Has not been taking prescribed psychiatric medications.  Today patient presents alert, attentive, and pleasant on approach. With Probation officer, patient presents calm, without pressured speech, reporting improving mood. Denies SI, and presents future oriented, hoping for discharge soon. Denies medication side effects. Of note, has reported rash to RN. We reviewed and I examined patient. Has two localized areas of erythema /plaque on pelvic area which he states are related to contact dermatitis from nickel on belt buckle. No current medication related rash noted or described , no mucosal surface involvement. Staff reports that patient continues to sleep poorly, last night waking up and ambulating on unit after only an hour and a half of sleep, although he states he did sleep more later . They report ongoing intrusive behaviors. Examples are him often  trying to initiate conversations with peers and staff , walking into other patients' rooms , having difficulty maintaining boundaries  with other patients. He has not exhibited any overt disruptive or threatening/agitated behaviors. He has been redirectable, although requiring frequent gentle redirection from staff. As noted, his GF , with whom he lives, came to visit him yesterday, and with his express consent I spoke with her via phone. She reports that patient seems partially improved but still clearly not at baseline and reports he is still " talking too much, hypersexual". She states he had made some threatening statements to her via phone last week , but not since then and that he did not exhibit any aggressive or threatening behavior during her visit. She does report that patient has a history of prior manic episodes which historically have taken several weeks to fully improve, even with medications.  GF reports that she did not proceed with 2 B placement and that patient can return home when discharged .  Denies medication side effects. Denies suicidal ideations.  Past Medical History:  Past Medical History:  Diagnosis Date  . Bipolar 1 disorder ([place])    History reviewed. No pertinent surgical history. Family History: History reviewed. No pertinent family history. Family Psychiatric  History: Does not endorse family psychiatric history at this time Social History:  Social History   Substance and Sexual Activity  Alcohol Use Yes  . Alcohol/week: 0.0 standard drinks   Comment: occ; special occ     Social History   Substance and Sexual Activity  Drug Use Yes  . Types: Marijuana    Social History   Socioeconomic History  . Marital status: Single    Spouse name: Not on file  . Number of children: Not on file  . Years  of education: Not on file  . Highest education level: Not on file  Occupational History  . Not on file  Social Needs  . Financial resource strain: Not on file  . Food insecurity    Worry: Not on file    Inability: Not on file  . Transportation needs    Medical: Not on file    Non-medical:  Not on file  Tobacco Use  . Smoking status: Current Every Day Smoker    Packs/day: 2.00    Types: Cigarettes  . Smokeless tobacco: Never Used  Substance and Sexual Activity  . Alcohol use: Yes    Alcohol/week: 0.0 standard drinks    Comment: occ; special occ  . Drug use: Yes    Types: Marijuana  . Sexual activity: Yes    Birth control/protection: None  Lifestyle  . Physical activity    Days per week: Not on file    Minutes per session: Not on file  . Stress: Not on file  Relationships  . Social Herbalist on phone: Not on file    Gets together: Not on file    Attends religious service: Not on file    Active member of club or organization: Not on file    Attends meetings of clubs or organizations: Not on file    Relationship status: Not on file  Other Topics Concern  . Not on file  Social History Narrative  . Not on file   Additional Social History:   Sleep: Fair  Appetite:  Good  Current Medications: Current Facility-Administered Medications  Medication Dose Route Frequency Provider Last Rate Last Dose  . benzocaine (ORAJEL) 10 % mucosal gel   Mouth/Throat TID PRN Cobos, Myer Peer, MD      . clobetasol cream (TEMOVATE) 0.05 %   Topical BID Lindon Romp A, NP      . hydrocortisone cream 0.5 %   Topical BID Cobos, Fernando A, MD      . ibuprofen (ADVIL) tablet 600 mg  600 mg Oral Q6H PRN Benton, Rashawnda L, RN   600 mg at 10/15/18 0023  . LORazepam (ATIVAN) tablet 0.5 mg  0.5 mg Oral Q8H PRN Cobos, Myer Peer, MD   0.5 mg at 10/15/18 0520  . LORazepam (ATIVAN) tablet 1 mg  1 mg Oral BID Cobos, Myer Peer, MD   1 mg at 10/15/18 1148  . neomycin-polymyxin-hydrocortisone (CORTISPORIN) OTIC (EAR) suspension 4 drop  4 drop Right EAR Q6H Cobos, Myer Peer, MD   4 drop at 10/15/18 1149  . nicotine (NICODERM CQ - dosed in mg/24 hours) patch 21 mg  21 mg Transdermal Daily Cobos, Myer Peer, MD   21 mg at 10/15/18 0820  . OLANZapine (ZYPREXA) tablet 10 mg  10 mg Oral  BID Johnn Hai, MD   10 mg at 10/15/18 0819  . OLANZapine zydis (ZYPREXA) disintegrating tablet 5 mg  5 mg Oral Q8H PRN Cobos, Myer Peer, MD       And  . ziprasidone (GEODON) injection 20 mg  20 mg Intramuscular PRN Cobos, Myer Peer, MD      . Oxcarbazepine (TRILEPTAL) tablet 300 mg  300 mg Oral BID Cobos, Myer Peer, MD   300 mg at 10/15/18 0819  . temazepam (RESTORIL) capsule 30 mg  30 mg Oral QHS Johnn Hai, MD   30 mg at 10/14/18 2127    Lab Results:  No results found for this or any previous visit (from the past 58 hour(s)).  Blood Alcohol level:  Lab Results  Component Value Date   ETH <10 10/08/2018   ETH <10 22/29/7989    Metabolic Disorder Labs: Lab Results  Component Value Date   HGBA1C 5.0 10/10/2018   MPG 96.8 10/10/2018   MPG 97 08/01/2016   No results found for: PROLACTIN Lab Results  Component Value Date   CHOL 103 10/10/2018   TRIG 74 10/10/2018   HDL 34 (L) 10/10/2018   CHOLHDL 3.0 10/10/2018   VLDL 15 10/10/2018   LDLCALC 54 10/10/2018   LDLCALC 53 08/01/2016    Physical Findings: AIMS: Facial and Oral Movements Muscles of Facial Expression: None, normal Lips and Perioral Area: None, normal Jaw: None, normal Tongue: None, normal,Extremity Movements Upper (arms, wrists, hands, fingers): None, normal Lower (legs, knees, ankles, toes): None, normal, Trunk Movements Neck, shoulders, hips: None, normal, Overall Severity Severity of abnormal movements (highest score from questions above): None, normal Incapacitation due to abnormal movements: None, normal Patient's awareness of abnormal movements (rate only patient's report): No Awareness, Dental Status Current problems with teeth and/or dentures?: Yes Does patient usually wear dentures?: No  CIWA:    COWS:     Musculoskeletal: Strength & Muscle Tone: within normal limits Gait & Station: normal Patient leans: N/A  Psychiatric Specialty Exam: Physical Exam  Constitutional: He is oriented  to person, place, and time. He appears well-developed and well-nourished. No distress.  Respiratory: Effort normal. No respiratory distress.  Musculoskeletal: Normal range of motion.  Neurological: He is alert and oriented to person, place, and time.  Skin: He is not diaphoretic.    Review of Systems  Constitutional: Negative for chills, diaphoresis, fever, malaise/fatigue and weight loss.  HENT:       Tooth Pain. Reports broken tooth.  Respiratory: Negative for cough and shortness of breath.   Cardiovascular: Negative for chest pain.  Gastrointestinal: Negative for diarrhea, nausea and vomiting.  Neurological: Negative for headaches.  No headache, no chest pain, no shortness of breath, no vomiting, no fever, no chills.  Describes toothache  Blood pressure 107/87, pulse 100, temperature 97.8 F (36.6 C), temperature source Oral, resp. rate 16, height _0  (1.778 m), weight 65.8 kg, SpO2 98 %.Body mass index is 20.81 kg/m.  General Appearance: Fairly Groomed  Eye Contact:  Good  Speech:  Normal Rate not pressured at this time  Volume:  Normal  Mood:  partially improved but still exhibiting some mania  Affect:  at this time does not present irritable  Thought Process:  Better organized, but still tangential at times   Orientation:  Other:  fully alert and attentive  Thought Content:  no hallucinations, no delusions , not internally preoccupied   Suicidal Thoughts:  No denies suicidal or self injurious ideations, denies homicidal or violent ideations  Homicidal Thoughts:  No  Memory:  recent and remote grossly intact   Judgement:  Fair/partial improvement   Insight:  Fair  Psychomotor Activity: No current psychomotor restlessness or agitation.  At times pacing on unit  Concentration:  Concentration: Improving compared to admission and Attention Span: Improving compared to admission  Recall:  Good  Fund of Knowledge:  Good  Language:  Good  Akathisia:  Negative  Handed:  Right   AIMS (if indicated):     Assets:  Desire for Improvement Resilience  ADL's:  Intact  Cognition:  WNL  Sleep:  Number of Hours: 1.5   Assessment :  38 year old male, history of bipolar disorder.  Had recently presented to ED  describing incipient symptoms of mania.  Improved with treatment, was discharged.  Returned to ED under IV commitment initiated by family due to disorganized, manic behaviors including threatening family, behaving aggressively.  Patient presents with pressured speech, disorganized thought process, expansive affect.  Has not been taking prescribed psychiatric medications.  Patient is presenting with gradual improvement compared to admission ( less pressured, less expansive, better organized thought process, less pressured speech). He does continue to sleep poorly, have difficulty maintaining boundaries and exhibiting intrusive behaviors. GF has visited and acknowledges partial improvement but states he is still far from his baseline functioning .States that patient has history of prior manic episodes which improved slowly even with treatment. Currently states she did not take 50 B out on him and that he will be able to return home at discharge. Patient denies medication side effects.  Treatment Plan Summary: Daily contact with patient to assess and evaluate symptoms and progress in treatment, Medication management, Plan inpatient treatment and medications as below  Treatment plan reviewed as below today 10/5 Encourage group and milieu participation  Encourage efforts to maintain abstinence Increase  Oxcarbazepine  300 mg QAM and 600 mgrs QHS for mood disorder Continue Zyprexa  10 mgrs BID  for mood disorder  Continue Ativan 1 mg BID for mood disorder,restlessness, insomnia Continue Restoril 30 mgrs QHS for insomnia /mood disorder Continue agitation protocol for agitation if needed.    Mitchell Campus, MD 10/15/2018, 11:54 AM   Patient ID: Paulita Fujita, male   DOB:  05-03-1980, 38 y.o.   MRN: [id number]

## 2018-10-15 NOTE — Progress Notes (Signed)
DAR NOTE: Patient presents with anxious affect and depressed mood.  Denies suicidal thoughts, auditory and visual hallucinations.  Reports withdrawal symptoms of cravings, agitation and irritability on self inventory form.  Continues to be intrusive with poor boundary control.  Needed a lot of redirection on the unit.  Rates depression at 0, hopelessness at 0, and anxiety at 3.  Maintained on routine safety checks.  Medications given as prescribed.  Support and encouragement offered as needed.  Attended group and participated.  States goal for today is "discharge so I can fixed my teeth and see my sons."  Patient observed socializing with peers in the dayroom.  Patient is safe on and off the unit.

## 2018-10-15 NOTE — Progress Notes (Signed)
Recreation Therapy Notes  Date:  10.5.20 Time: 0930 Location: 300 Hall Dayroom  Group Topic: Stress Management  Goal Area(s) Addresses:  Patient will identify positive stress management techniques. Patient will identify benefits of using stress management post d/c.  Behavioral Response: Engaged  Intervention: Stress Management  Activity : Music.  Patients were given the opportunity to request songs that were soothing, peaceful and feel good.  Songs were to be appropriate meaning no cursing, not sexually explicit or any language about drug/alcohol use.   Education:  Stress Management, Discharge Planning.   Education Outcome: Acknowledges Education  Clinical Observations/Feedback: Pt attended and participated in activity.     Victorino Sparrow, LRT/CTRS         Victorino Sparrow A 10/15/2018 10:38 AM

## 2018-10-15 NOTE — Tx Team (Signed)
Interdisciplinary Treatment and Diagnostic Plan Update  10/15/2018 Time of Session: 9:25am Mitchell Mckee MRN: [id number]  Principal Diagnosis: <principal problem not specified>  Secondary Diagnoses: Active Problems:   Bipolar disorder ([place])   Current Medications:  Current Facility-Administered Medications  Medication Dose Route Frequency Provider Last Rate Last Dose  . benzocaine (ORAJEL) 10 % mucosal gel   Mouth/Throat TID PRN Cobos, Myer Peer, MD      . clobetasol cream (TEMOVATE) 0.05 %   Topical BID Lindon Romp A, NP      . hydrocortisone cream 0.5 %   Topical BID Cobos, Fernando A, MD      . ibuprofen (ADVIL) tablet 600 mg  600 mg Oral Q6H PRN Benton, Rashawnda L, RN   600 mg at 10/15/18 0023  . LORazepam (ATIVAN) tablet 0.5 mg  0.5 mg Oral Q8H PRN Cobos, Myer Peer, MD   0.5 mg at 10/15/18 0520  . LORazepam (ATIVAN) tablet 1 mg  1 mg Oral BID Cobos, Myer Peer, MD   1 mg at 10/14/18 1649  . neomycin-polymyxin-hydrocortisone (CORTISPORIN) OTIC (EAR) suspension 4 drop  4 drop Right EAR Q6H Cobos, Myer Peer, MD   4 drop at 10/15/18 0630  . nicotine (NICODERM CQ - dosed in mg/24 hours) patch 21 mg  21 mg Transdermal Daily Cobos, Myer Peer, MD   21 mg at 10/15/18 0820  . OLANZapine (ZYPREXA) tablet 10 mg  10 mg Oral BID Johnn Hai, MD   10 mg at 10/15/18 0819  . OLANZapine zydis (ZYPREXA) disintegrating tablet 5 mg  5 mg Oral Q8H PRN Cobos, Myer Peer, MD       And  . ziprasidone (GEODON) injection 20 mg  20 mg Intramuscular PRN Cobos, Myer Peer, MD      . Oxcarbazepine (TRILEPTAL) tablet 300 mg  300 mg Oral BID Cobos, Myer Peer, MD   300 mg at 10/15/18 0819  . temazepam (RESTORIL) capsule 30 mg  30 mg Oral QHS Johnn Hai, MD   30 mg at 10/14/18 2127   PTA Medications: Medications Prior to Admission  Medication Sig Dispense Refill Last Dose  . benztropine (COGENTIN) 1 MG tablet Take 1 tablet (1 mg total) by mouth at bedtime. (Patient not taking: Reported on 10/05/2018) 30  tablet 0   . divalproex (DEPAKOTE ER) 500 MG 24 hr tablet Take 2 tablets (1,000 mg total) by mouth at bedtime. (Patient not taking: Reported on 10/05/2018) 60 tablet 0   . hydrOXYzine (ATARAX/VISTARIL) 25 MG tablet Take 1 tablet (25 mg total) by mouth 3 (three) times daily as needed for anxiety. (Patient not taking: Reported on 08/14/2016) 30 tablet 0   . hydrOXYzine (ATARAX/VISTARIL) 50 MG tablet Take 50 mg by mouth 3 (three) times daily as needed for anxiety.  0   . nicotine (NICODERM CQ - DOSED IN MG/24 HOURS) 21 mg/24hr patch Place 1 patch (21 mg total) onto the skin daily. (Patient not taking: Reported on 10/05/2018) 28 patch 0   . risperiDONE (RISPERDAL) 3 MG tablet Take 1 tablet (3 mg total) by mouth at bedtime. (Patient not taking: Reported on 10/05/2018) 30 tablet 0   . traZODone (DESYREL) 150 MG tablet Take 1 tablet (150 mg total) by mouth at bedtime. (Patient not taking: Reported on 10/05/2018) 30 tablet 0     Patient Stressors: Marital or family conflict Medication change or noncompliance Substance abuse  Patient Strengths: Active sense of humor Supportive family/friends Work skills  Treatment Modalities: Medication Management, Group therapy, Case management,  1 to 1 session with clinician, Psychoeducation, Recreational therapy.   Physician Treatment Plan for Primary Diagnosis: <principal problem not specified> Long Term Goal(s): Improvement in symptoms so as ready for discharge Improvement in symptoms so as ready for discharge   Short Term Goals: Ability to identify changes in lifestyle to reduce recurrence of condition will improve Ability to verbalize feelings will improve Ability to disclose and discuss suicidal ideas Ability to demonstrate self-control will improve Ability to identify and develop effective coping behaviors will improve Ability to maintain clinical measurements within normal limits will improve Ability to identify changes in lifestyle to reduce recurrence of  condition will improve Ability to verbalize feelings will improve Ability to disclose and discuss suicidal ideas Ability to demonstrate self-control will improve Ability to identify and develop effective coping behaviors will improve Ability to maintain clinical measurements within normal limits will improve  Medication Management: Evaluate patient's response, side effects, and tolerance of medication regimen.  Therapeutic Interventions: 1 to 1 sessions, Unit Group sessions and Medication administration.  Evaluation of Outcomes: Not Met  Physician Treatment Plan for Secondary Diagnosis: Active Problems:   Bipolar disorder ([place])  Long Term Goal(s): Improvement in symptoms so as ready for discharge Improvement in symptoms so as ready for discharge   Short Term Goals: Ability to identify changes in lifestyle to reduce recurrence of condition will improve Ability to verbalize feelings will improve Ability to disclose and discuss suicidal ideas Ability to demonstrate self-control will improve Ability to identify and develop effective coping behaviors will improve Ability to maintain clinical measurements within normal limits will improve Ability to identify changes in lifestyle to reduce recurrence of condition will improve Ability to verbalize feelings will improve Ability to disclose and discuss suicidal ideas Ability to demonstrate self-control will improve Ability to identify and develop effective coping behaviors will improve Ability to maintain clinical measurements within normal limits will improve     Medication Management: Evaluate patient's response, side effects, and tolerance of medication regimen.  Therapeutic Interventions: 1 to 1 sessions, Unit Group sessions and Medication administration.  Evaluation of Outcomes: Not Met   RN Treatment Plan for Primary Diagnosis: <principal problem not specified> Long Term Goal(s): Knowledge of disease and therapeutic regimen to  maintain health will improve  Short Term Goals: Ability to participate in decision making will improve, Ability to verbalize feelings will improve, Ability to disclose and discuss suicidal ideas and Ability to identify and develop effective coping behaviors will improve  Medication Management: RN will administer medications as ordered by provider, will assess and evaluate patient's response and provide education to patient for prescribed medication. RN will report any adverse and/or side effects to prescribing provider.  Therapeutic Interventions: 1 on 1 counseling sessions, Psychoeducation, Medication administration, Evaluate responses to treatment, Monitor vital signs and CBGs as ordered, Perform/monitor CIWA, COWS, AIMS and Fall Risk screenings as ordered, Perform wound care treatments as ordered.  Evaluation of Outcomes: Not Met   LCSW Treatment Plan for Primary Diagnosis: <principal problem not specified> Long Term Goal(s): Safe transition to appropriate next level of care at discharge, Engage patient in therapeutic group addressing interpersonal concerns.  Short Term Goals: Engage patient in aftercare planning with referrals and resources  Therapeutic Interventions: Assess for all discharge needs, 1 to 1 time with Social worker, Explore available resources and support systems, Assess for adequacy in community support network, Educate family and significant other(s) on suicide prevention, Complete Psychosocial Assessment, Interpersonal group therapy.  Evaluation of Outcomes: Not Met   Progress  in Treatment: Attending groups: Yes. Participating in groups: Yes. Taking medication as prescribed: Yes. Toleration medication: Yes. Family/Significant other contact made: Yes, individual(s) contacted:  contacted ex-girlfriend Patient understands diagnosis: Yes. Discussing patient identified problems/goals with staff: Yes. Medical problems stabilized or resolved: Yes. Denies  suicidal/homicidal ideation: Yes. Issues/concerns per patient self-inventory: No. Other:   New problem(s) identified: None   New Short Term/Long Term Goal(s):  medication stabilization, elimination of SI thoughts, development of comprehensive mental wellness plan.    Patient Goals:  "I just need to get out of here. I am a little manic because I am here. I need some new Bipolar medicine"  Discharge Plan or Barriers: Plans to follow up with [facility].    Reason for Continuation of Hospitalization: Anxiety Depression Mania Medication stabilization  Estimated Length of Stay: 3-5 days   Attendees: Patient: Mitchell Mckee 10/15/2018 10:34 AM  Physician: Dr. Neita Garnet, MD 10/15/2018 10:34 AM  Nursing: Benjamine Mola.Jenetta Downer, RN 10/15/2018 10:34 AM  RN Care Manager: 10/15/2018 10:34 AM  Social Worker: Radonna Ricker, [place] 10/15/2018 10:34 AM  Recreational Therapist:  10/15/2018 10:34 AM  Other: Harriett Sine, NP  10/15/2018 10:34 AM  Other:  10/15/2018 10:34 AM  Other: 10/15/2018 10:34 AM    Scribe for Treatment Team: Joellen Jersey, [place] 10/15/2018 10:34 AM

## 2018-10-15 NOTE — Progress Notes (Addendum)
D.  Pt up and visible in milieu, continued complaint of anxiety.  Pt does continue to be intrusive at times.  Pt did agree to take Restoril as ordered at night but is not sleeping more than a couple of hours with that.  Pt wants ordered ear drops in both ears rather than just the right ear.  Pt denies SI/HI/AVH at this time.  A.  Support and encouragement offered, medication given as ordered.  R. Pt remains safe on the unit,will continue to monitor.

## 2018-10-16 DIAGNOSIS — F312 Bipolar disorder, current episode manic severe with psychotic features: Secondary | ICD-10-CM

## 2018-10-16 MED ORDER — OLANZAPINE 7.5 MG PO TABS
15.0000 mg | ORAL_TABLET | Freq: Every day | ORAL | Status: DC
  Administered 2018-10-16: 21:00:00 15 mg via ORAL
  Filled 2018-10-16 (×2): qty 2

## 2018-10-16 MED ORDER — OLANZAPINE 10 MG PO TABS
10.0000 mg | ORAL_TABLET | Freq: Every day | ORAL | Status: DC
  Administered 2018-10-17: 10 mg via ORAL
  Filled 2018-10-16 (×2): qty 1

## 2018-10-16 MED ORDER — TEMAZEPAM 30 MG PO CAPS
45.0000 mg | ORAL_CAPSULE | Freq: Every day | ORAL | Status: DC
  Administered 2018-10-16: 45 mg via ORAL
  Filled 2018-10-16: qty 1

## 2018-10-16 MED ORDER — ARIPIPRAZOLE ER 400 MG IM SRER
400.0000 mg | INTRAMUSCULAR | Status: DC
  Administered 2018-10-17: 400 mg via INTRAMUSCULAR

## 2018-10-16 MED ORDER — ARIPIPRAZOLE 2 MG PO TABS
2.0000 mg | ORAL_TABLET | Freq: Once | ORAL | Status: AC
  Administered 2018-10-16: 2 mg via ORAL
  Filled 2018-10-16 (×2): qty 1

## 2018-10-16 MED ORDER — LITHIUM CARBONATE ER 450 MG PO TBCR
450.0000 mg | EXTENDED_RELEASE_TABLET | Freq: Two times a day (BID) | ORAL | Status: DC
  Administered 2018-10-16 – 2018-10-17 (×3): 450 mg via ORAL
  Filled 2018-10-16 (×6): qty 1

## 2018-10-16 MED ORDER — ARIPIPRAZOLE ER 400 MG IM SRER
400.0000 mg | INTRAMUSCULAR | Status: DC
  Filled 2018-10-16: qty 2

## 2018-10-16 MED ORDER — HYDROXYZINE HCL 50 MG PO TABS
50.0000 mg | ORAL_TABLET | Freq: Four times a day (QID) | ORAL | Status: DC | PRN
  Administered 2018-10-16: 50 mg via ORAL
  Filled 2018-10-16: qty 1

## 2018-10-16 NOTE — Progress Notes (Signed)
CSW spoke with the patient's girlfriend, Meyer Cory [phone]) regarding his discharge planning.   Denny Peon reports the patient can return home at discharge, however she does not feel he is ready for discharge at this time. She reports she spoke to the patient and he continues to present "extremely manic".   She reports she understand the patient does not have anywhere to go when he leaves the hospital, so she is allowing him to return home.   CSW will continue to follow.    Radonna Ricker, MSW, LCSW Clinical Social Worker [facility]  Phone: [phone]

## 2018-10-16 NOTE — Progress Notes (Signed)
Patient is irritable at this time after being told to please return to his room. The patient has been awake and pacing since 0100 approximately. He insists that he has been asleep for five hours when in fact he has slept for 1.5 hours. Most recently, he stated that he is going to "flip out" in his room due to claustrophobia. The patient's nurse has been made aware of these developments.

## 2018-10-16 NOTE — Progress Notes (Signed)
Adult Psychoeducational Group Note  Date:  10/16/2018 Time:  10:59 PM  Group Topic/Focus:  Wrap-Up Group:   The focus of this group is to help patients review their daily goal of treatment and discuss progress on daily workbooks.  Participation Level:  Active  Participation Quality:  Appropriate  Affect:  Appropriate  Cognitive:  Appropriate  Insight: Appropriate  Engagement in Group:  Developing/Improving  Modes of Intervention:  Discussion  Additional Comments:  Pt stated his goal for today was to focus on her treatment plan. Pt stated he felt he accomplished his goal today. Pt stated his relationship with his family has improved since he was admitted here. Pt stated he felt better about himself today. Pt rated his overall day a 9 out of 10. Pt stated his appetite was pretty good today. Pt stated his goal for tonight is to get some rest. Pt did not complain of pain tonight.  Pt stated he was not hearing or seeing anything that was not there. Pt stated he had no thoughts of harming himself or others. Pt stated he would alert staff if anything changes.  Candy Sledge 10/16/2018, 10:59 PM

## 2018-10-16 NOTE — Progress Notes (Signed)
Spiritual care group on grief and loss facilitated by chaplain Jerene Pitch  Group Goal:  Support / Education around grief and loss Members engage in facilitated group support and psycho-social education.  Group Description:  Following introductions and group rules, group members engaged in facilitated group dialog and support around topic of loss, with particular support around experiences of loss in their lives. Group Identified types of loss (relationships / self / things) and identified patterns, circumstances, and changes that precipitate losses. Reflected on thoughts / feelings around loss, normalized grief responses, and recognized variety in grief experience. Patient Progress:  Mitchell Mckee was present throughout group.  Engaged with other group members around his grief process and especially the role substance use plays in his grief.  Noted that he "stuffs things down" and then "explodes."  He engages with other group members around strategies for noticing this.  He spoke with group members about self-care and setting healthy boundaries with relationships in his life.  Offered affirmation and support of group members.

## 2018-10-16 NOTE — Progress Notes (Signed)
D: Pt denies SI/HI/AV hallucinations. Pt is restless, pacing hall, irritable tonight. Patient did not want to take his sleep medication. He however did agree to take it later if he did not fall asleep. Patient continued to pace hall most of night; he did go to his room a stay a short while. Patient is not sleeping at night.  A: Pt was offered support and encouragement. Pt was given scheduled medications. Pt was encourage to attend groups. Q 15 minute checks were done for safety.  R:Pt attends groups and interacts  with  staff. Pt is taking medication. Pt has no other complaints.Pt receptive to treatment and safety maintained on unit.

## 2018-10-16 NOTE — Progress Notes (Signed)
[facility]  10/16/2018 11:20 AM Mitchell Mckee  MRN:  [id number] Subjective:    Patient continues to try very hard to contain his mood however volatility at home was noted.  Further he needs continued inpatient care.  Spoke with girlfriend with permission she indicated that he was seeking intimacy in the hospital during visiting hours further that his thought processes "still manic"  No EPS or TD patient again has a generally contained mood is hopeful that he can be discharged soon but we discussed the risk-benefit side effects of oxcarbazepine including hyponatremia which I will explained to him in more detail.  No EPS or TD at this point though no side effects from meds but did not sleep really less than an hour  Principal Problem: Bipolar manic with psychosis Diagnosis: Active Problems:   Bipolar disorder ([facility])  Total Time spent with patient: 30 minutes  Past Psychiatric History: see eval  Past Medical History:  Past Medical History:  Diagnosis Date  . Bipolar 1 disorder ([facility])    History reviewed. No pertinent surgical history. Family History: History reviewed. No pertinent family history. Family Psychiatric  History: see eval Social History:  Social History   Substance and Sexual Activity  Alcohol Use Yes  . Alcohol/week: 0.0 standard drinks   Comment: occ; special occ     Social History   Substance and Sexual Activity  Drug Use Yes  . Types: Marijuana    Social History   Socioeconomic History  . Marital status: Single    Spouse name: Not on file  . Number of children: Not on file  . Years of education: Not on file  . Highest education level: Not on file  Occupational History  . Not on file  Social Needs  . Financial resource strain: Not on file  . Food insecurity    Worry: Not on file    Inability: Not on file  . Transportation needs    Medical: Not on file    Non-medical: Not on file  Tobacco Use  . Smoking status: Current Every Day Smoker    Packs/day: 2.00    Types: Cigarettes  . Smokeless tobacco: Never Used  Substance and Sexual Activity  . Alcohol use: Yes    Alcohol/week: 0.0 standard drinks    Comment: occ; special occ  . Drug use: Yes    Types: Marijuana  . Sexual activity: Yes    Birth control/protection: None  Lifestyle  . Physical activity    Days per week: Not on file    Minutes per session: Not on file  . Stress: Not on file  Relationships  . Social Musicianconnections    Talks on phone: Not on file    Gets together: Not on file    Attends religious service: Not on file    Active member of club or organization: Not on file    Attends meetings of clubs or organizations: Not on file    Relationship status: Not on file  Other Topics Concern  . Not on file  Social History Narrative  . Not on file   Additional Social History:                         Sleep: Poor  Appetite:  Fair  Current Medications: Current Facility-Administered Medications  Medication Dose Route Frequency Provider Last Rate Last Dose  . ARIPiprazole (ABILIFY) tablet 2 mg  2 mg Oral Once Malvin JohnsFarah, Laramie Meissner, MD      . [  START ON 10/17/2018] ARIPiprazole ER (ABILIFY MAINTENA) injection 400 mg  400 mg Intramuscular Q28 days Malvin Johns, MD      . benzocaine (ORAJEL) 10 % mucosal gel   Mouth/Throat TID PRN Cobos, Rockey Situ, MD      . clobetasol cream (TEMOVATE) 0.05 %   Topical BID Nira Conn A, NP      . hydrocortisone cream 0.5 %   Topical BID Cobos, Rockey Situ, MD      . lithium carbonate (ESKALITH) CR tablet 450 mg  450 mg Oral Q12H Malvin Johns, MD      . LORazepam (ATIVAN) tablet 1 mg  1 mg Oral BID Cobos, Rockey Situ, MD   1 mg at 10/16/18 0758  . LORazepam (ATIVAN) tablet 1 mg  1 mg Oral Q8H PRN Cobos, Rockey Situ, MD   1 mg at 10/16/18 0400  . neomycin-polymyxin-hydrocortisone (CORTISPORIN) OTIC (EAR) suspension 4 drop  4 drop Right EAR Q6H Cobos, Rockey Situ, MD   4 drop at 10/16/18 0800  . nicotine (NICODERM CQ - dosed in mg/24  hours) patch 21 mg  21 mg Transdermal Daily Cobos, Rockey Situ, MD   21 mg at 10/16/18 0758  . [START ON 10/17/2018] OLANZapine (ZYPREXA) tablet 10 mg  10 mg Oral Daily Malvin Johns, MD      . OLANZapine [facility]) tablet 15 mg  15 mg Oral QHS Malvin Johns, MD      . OLANZapine zydis [facility]) disintegrating tablet 5 mg  5 mg Oral Q8H PRN Cobos, Rockey Situ, MD       And  . ziprasidone (GEODON) injection 20 mg  20 mg Intramuscular PRN Cobos, Rockey Situ, MD      . Oxcarbazepine (TRILEPTAL) tablet 300 mg  300 mg Oral BH-q7a Cobos, Rockey Situ, MD   300 mg at 10/16/18 0654  . Oxcarbazepine (TRILEPTAL) tablet 600 mg  600 mg Oral QHS Cobos, Rockey Situ, MD   600 mg at 10/15/18 2107  . temazepam (RESTORIL) capsule 30 mg  30 mg Oral QHS Malvin Johns, MD   30 mg at 10/15/18 2346    Lab Results: No results found for this or any previous visit (from the past 48 hour(s)).  Blood Alcohol level:  Lab Results  Component Value Date   ETH <10 10/08/2018   ETH <10 10/05/2018    Metabolic Disorder Labs: Lab Results  Component Value Date   HGBA1C 5.0 10/10/2018   MPG 96.8 10/10/2018   MPG 97 08/01/2016   No results found for: PROLACTIN Lab Results  Component Value Date   CHOL 103 10/10/2018   TRIG 74 10/10/2018   HDL 34 (L) 10/10/2018   CHOLHDL 3.0 10/10/2018   VLDL 15 10/10/2018   LDLCALC 54 10/10/2018   LDLCALC 53 08/01/2016    Physical Findings: AIMS: Facial and Oral Movements Muscles of Facial Expression: None, normal Lips and Perioral Area: None, normal Jaw: None, normal Tongue: None, normal,Extremity Movements Upper (arms, wrists, hands, fingers): None, normal Lower (legs, knees, ankles, toes): None, normal, Trunk Movements Neck, shoulders, hips: None, normal, Overall Severity Severity of abnormal movements (highest score from questions above): None, normal Incapacitation due to abnormal movements: None, normal Patient's awareness of abnormal movements (rate only patient's report): No  Awareness, Dental Status Current problems with teeth and/or dentures?: Yes Does patient usually wear dentures?: No  CIWA:    COWS:     Musculoskeletal: Strength & Muscle Tone: within normal limits Gait & Station: normal Patient leans: N/A  Psychiatric  Specialty Exam: Physical Exam  ROS  Blood pressure 114/82, pulse 93, temperature 97.7 F (36.5 C), temperature source Oral, resp. rate 16, height 5\' 10"  (1.778 m), weight 65.8 kg, SpO2 98 %.Body mass index is 20.81 kg/m.  General Appearance: Disheveled  Eye Contact:  Fair  Speech:  Clear and Coherent  Volume:  Normal  Mood:  variable  Affect:  Congruent and Labile  Thought Process:  Irrelevant and Descriptions of Associations: Loose  Orientation:  Full (Time, Place, and Person)  Thought Content:  Illogical and Tangential  Suicidal Thoughts:  No  Homicidal Thoughts:  No  Memory:  Immediate;   Fair Recent;   Fair Remote;   Good  Judgement:  Impaired  Insight:  Lacking  Psychomotor Activity:  Normal  Concentration:  Concentration: Fair and Attention Span: Fair  Recall:  [employer] of Knowledge:  Good  Language:  Good  Akathisia:  Negative  Handed:  Right  AIMS (if indicated):     Assets:  Communication Skills Housing Leisure Time Physical Health Resilience Social Support  ADL's:  Intact  Cognition:  WNL  Sleep:  Number of Hours: 1.5     Treatment Plan Summary: Daily contact with patient to assess and evaluate symptoms and progress in treatment and Medication management  Continue current olanzapine but escalate by 5 mg day augment with lithium, also add temazepam at bedtime told to abstain from cannabis as well probable discharge by the end of the week further we will give test dose of aripiprazole in anticipation of long-acting injectable   Mykell Rawl, MD 10/16/2018, 11:20 AM

## 2018-10-17 MED ORDER — OLANZAPINE 15 MG PO TABS: 15.0000 mg | ORAL_TABLET | Freq: Two times a day (BID) | ORAL | 2 refills | Status: AC

## 2018-10-17 MED ORDER — NEOMYCIN-POLYMYXIN-HC 3.5-10000-1 OT SUSP: 4.0000 [drp] | Freq: Four times a day (QID) | OTIC | 0 refills | Status: AC

## 2018-10-17 MED ORDER — LITHIUM CARBONATE ER 450 MG PO TBCR: 450.0000 mg | EXTENDED_RELEASE_TABLET | Freq: Two times a day (BID) | ORAL | 2 refills | Status: AC

## 2018-10-17 MED ORDER — ARIPIPRAZOLE ER 400 MG IM SRER: 400.0000 mg | INTRAMUSCULAR | 11 refills | Status: AC

## 2018-10-17 MED ORDER — TEMAZEPAM 30 MG PO CAPS: 60.0000 mg | ORAL_CAPSULE | Freq: Every day | ORAL | 0 refills | Status: AC

## 2018-10-17 MED ORDER — OXCARBAZEPINE 300 MG PO TABS: ORAL_TABLET | ORAL | 2 refills | Status: AC

## 2018-10-17 NOTE — [facility] Suicide Risk Assessment (Signed)
[facility] Discharge Suicide Risk Assessment   Principal Problem: BPAD Discharge Diagnoses: Active Problems:   Bipolar disorder ([place])   Total Time spent with patient: 45 minutes  Musculoskeletal: Strength & Muscle Tone: within normal limits Gait & Station: normal Patient leans: N/A  Psychiatric Specialty Exam: ROS  Blood pressure 126/87, pulse 100, temperature 98 F (36.7 C), temperature source Oral, resp. rate 18, height 5\' 10"  (1.778 m), weight 65.8 kg, SpO2 98 %.Body mass index is 20.81 kg/m.  General Appearance: Casual  Eye Contact::  Good  Speech:  Clear and Coherent409  Volume:  Normal  Mood:  Euthymic  Affect:  Congruent  Thought Process:  Goal Directed, Linear and Descriptions of Associations: Circumstantial  Orientation:  Full (Time, Place, and Person)  Thought Content:  Logical  Suicidal Thoughts:  No  Homicidal Thoughts:  No  Memory: Generally intact however girlfriend reports that he has less recall of statements made in manic episode which is understandable  Judgement:  Fair  Insight:  Fair  Psychomotor Activity:  Normal  Concentration:  Fair  Recall:  [employer] of Knowledge:Fair  Language: Fair  Akathisia:  Negative  Handed:  Right  AIMS (if indicated):     Assets:  Desire for Improvement Housing Leisure Time Physical Health  Sleep:  Number of Hours: 3  Cognition: WNL  ADL's:  Intact   Mental Status Per Nursing Assessment::   On Admission:  NA  Demographic Factors:  Male  Loss Factors: NA  Historical Factors: Impulsivity  Risk Reduction Factors:   Sense of responsibility to family and Religious beliefs about death  Continued Clinical Symptoms: BPAD Cognitive Features That Contribute To Risk:  None    Suicide Risk:  Minimal: No identifiable suicidal ideation.  Patients presenting with no risk factors but with morbid ruminations; may be classified as minimal risk based on the severity of the depressive symptoms  [place], Mood Treatment Follow up.   Why: Medication management appointment with Elmarie Shiley   Therapy with Wende Bushy  Appts will be virtual and the providers will contact you.  Contact information: [address] [place] [address] [phone]           Plan Of Care/Follow-up recommendations:  Activity:  full  Verba Ainley, MD 10/17/2018, 8:23 AM

## 2018-10-17 NOTE — Progress Notes (Signed)
  [facility] Adult Case Management Discharge Plan :  Will you be returning to the same living situation after discharge:  Yes,  home. At discharge, do you have transportation home?: Yes,  will take his own Mel[place] Popper, or his father will pick him up. Do you have the ability to pay for your medications: No. Provided samples.  Release of information consent forms completed and in the chart. Work Quarry manager on chart.  Patient to Follow up at: Follow-up [place] Follow up on 10/18/2018.   Why: Medication management appointment with Elmarie Shiley is Thursday, 10/8 at 11:00a.    Therapy with Wende Bushy is Thursday, 10/8 at 5:00p.  Appts will be virtual and the providers will contact you.  Contact information: [address] [place] [address] [phone]           Next level of care provider has access to [facility] and Suicide Prevention discussed: Yes,  with girlfriend, Denny Peon   Has patient been referred to the Quitline?: Patient refused referral  Patient has been referred for addiction treatment: Yes  Joellen Jersey, [place] 10/17/2018, 9:48 AM

## 2018-10-17 NOTE — Progress Notes (Signed)
Patient ID: Mitchell Mckee, male   DOB: July 24, 1980, 38 y.o.   MRN: [id number] Patient denies SI, HI and AVH upon discharge.  Patient acknowledged understanding of all discharge instructions and receipt of personal belongings.

## 2018-10-17 NOTE — Discharge Summary (Signed)
Physician Discharge Summary Note  Patient:  Mitchell Mckee is an 38 y.o., male  MRN:  [id number]  DOB:  10-27-1980  Patient phone:  [phone] (home)   Patient address:   [address] [place] [address],   Total Time spent with patient: Greater than 30 minutes  Date of Admission:  10/09/2018  Date of Discharge: 10-17-18  Reason for Admission: Reported manic episodes, worsening insomnia & anxiety.  Principal Problem: Bipolar 1 disorder, manic, full remission [facility])  Discharge Diagnoses: Patient Active Problem List   Diagnosis Date Noted  . Bipolar 1 disorder, manic, full remission (HCC) [F31.74] 08/05/2016    Priority: High  . Bipolar disorder (HCC) [F31.9] 10/15/2018  . Substance induced mood disorder (HCC) [F19.94] 08/15/2016  . Bipolar disease, manic (HCC) [F31.10] 07/30/2016  . Cannabis use disorder, severe, dependence (HCC) [F12.20] 07/29/2016   Past Psychiatric History: Bipolar 1 disorder  Past Medical History:  Past Medical History:  Diagnosis Date  . Bipolar 1 disorder (HCC)    History reviewed. No pertinent surgical history.  Family History: History reviewed. No pertinent family history.  Family Psychiatric  History: See H&P  Social History:  Social History   Substance and Sexual Activity  Alcohol Use Yes  . Alcohol/week: 0.0 standard drinks   Comment: occ; special occ     Social History   Substance and Sexual Activity  Drug Use Yes  . Types: Marijuana    Social History   Socioeconomic History  . Marital status: Single    Spouse name: Not on file  . Number of children: Not on file  . Years of education: Not on file  . Highest education level: Not on file  Occupational History  . Not on file  Social Needs  . Financial resource strain: Not on file  . Food insecurity    Worry: Not on file    Inability: Not on file  . Transportation needs    Medical: Not on file    Non-medical: Not on file  Tobacco Use  . Smoking status: Current  Every Day Smoker    Packs/day: 2.00    Types: Cigarettes  . Smokeless tobacco: Never Used  Substance and Sexual Activity  . Alcohol use: Yes    Alcohol/week: 0.0 standard drinks    Comment: occ; special occ  . Drug use: Yes    Types: Marijuana  . Sexual activity: Yes    Birth control/protection: None  Lifestyle  . Physical activity    Days per week: Not on file    Minutes per session: Not on file  . Stress: Not on file  Relationships  . Social Musician on phone: Not on file    Gets together: Not on file    Attends religious service: Not on file    Active member of club or organization: Not on file    Attends meetings of clubs or organizations: Not on file    Relationship status: Not on file  Other Topics Concern  . Not on file  Social History Narrative  . Not on file   Hospital Course: (Per Md's admission evaluation): 38 year old male. Patient had presented to hospital recently (on 9/23) reporting concerns he was developing a manic episode and reporting poor sleep, increased anxiety . He was monitored in ED , discharged home on 9/26 on Depakote / Risperidone, but patient reports has not been taking medications due to perceived side effects. He returned  to hospital earlier today under  IVC initiated by his parents. States " the police came around and brought me to the hospital".  Reportedly had been behaving erratically, aggressively and had sent  threatening text messages / threatened to physically attack his father. Patient acknowledging making threats and states " I was just feeling angry", and denies any current violent or homicidal ideations towards father or anyone else.  Currently he  presents with manic symptoms including pressured speech,disorganized thought process. Patient acknowledges history of Bipolar Disorder and states " I know I have been manic for like a week now " and reports realizing that " I have been talking a lot and  have been staying up late playing  video games and can't go to sleep even when I try to". He states he has been facing significant stressors, to include concern about his GF whom he states also has Bipolar Disorder and has been working excessively / taking care of household. He has also reports he has  been off Depakote for several weeks ( reports he stopped due to alopecia and sexual side effects) . It also appears he has not been taking Risperidone .   Mitchell Mckee is known in this [facility] from his previous stay for mental health treatment/stabilization. This time around, he was admitted to the [facility] adult unit for mood stabilization treatment due to worsening symptoms of Bipolar disorder. His UDS was positive for Benzodiazepine &THC. He was recommended for mood stabilization treatments.   After evaluation of his presenting symptoms, the medication regimen targeting those symptoms were discussed & initiated with Mitchell Mckee's consent. He was medicated, stabilized & discharged on the medications as listed on the discharge medication lists below. He presented no other pre-existing medical problems that required treatment. He was enrolled & participated in the group counseling sessions being offered & held on this unit. He learned coping skills.  During the course of his hosptalization, Mitchell Mckee's improvement was monitored by observation & his daily report of symptom reduction noted.  His emotional & mental status were monitored by the daily self-inventory reports completed by him & the clinical staff. He reported continued improvement on daily basis & denied any new concerns. He was encouraged to attend groups to help with recognizing triggers of emotional crises & ways to cope better with them.         As his treatment continues, Mitchell Mckee was evaluated by the treatment team on daily basis for mood stability & the need for continued recovery after discharge. He was offered further treatment options upon discharge on an outpatient basis as noted below. He was encouraged  to maintain satisfactory support network & home environment to help with his recovery & mood stability. He was instructed & encouraged to adhere to his medication regimen as recommended by his treatment team.    Upon discharge, Otoniel was both mentally & medically stable denying suicidal/homicidal ideation, auditory/visual/tactile hallucinations, delusional thoughts and paranoia. He was provided with all the necessary information needed to make his outpatient appointment without problems. He left [facility] with all personal belongings in no distress.  Transportation per family (Father).   Physical Findings: AIMS: Facial and Oral Movements Muscles of Facial Expression: None, normal Lips and Perioral Area: None, normal Jaw: None, normal Tongue: None, normal,Extremity Movements Upper (arms, wrists, hands, fingers): None, normal Lower (legs, knees, ankles, toes): None, normal, Trunk Movements Neck, shoulders, hips: None, normal, Overall Severity Severity of abnormal movements (highest score from questions above): None, normal Incapacitation due to abnormal movements: None, normal Patient's awareness of abnormal  movements (rate only patient's report): No Awareness, Dental Status Current problems with teeth and/or dentures?: Yes Does patient usually wear dentures?: No  CIWA:    COWS:     Musculoskeletal: Strength & Muscle Tone: within normal limits Gait & Station: normal Patient leans: N/A  Psychiatric Specialty Exam: Physical Exam  Constitutional: He is oriented to person, place, and time. He appears well-developed.  HENT:  Head: Normocephalic.  Eyes: Pupils are equal, round, and reactive to light.  Neck: Normal range of motion.  Cardiovascular: Normal rate.  Respiratory: Effort normal.  GI: Soft.  Genitourinary:    Genitourinary Comments: Deferred   Musculoskeletal: Normal range of motion.  Neurological: He is alert and oriented to person, place, and time.  Skin: Skin is warm and dry.     Review of Systems  Constitutional: Negative.   HENT: Negative.   Eyes: Negative.   Respiratory: Negative.   Cardiovascular: Negative.   Gastrointestinal: Negative.   Genitourinary: Negative.   Musculoskeletal: Negative.   Skin: Negative.   Neurological: Negative.   Endo/Heme/Allergies: Negative.   Psychiatric/Behavioral: Positive for depression (Stabilized with medication prior to discharge) and substance abuse (Hx. Cannabis use disorder ). Negative for hallucinations, memory loss and suicidal ideas. The patient has insomnia (Stabilized with medication prior to discharge). The patient is not nervous/anxious ( Stabilized with medication prior to discharge).     Blood pressure 126/87, pulse 100, temperature 98 F (36.7 C), temperature source Oral, resp. rate 18, height 5\' 10"  (1.778 m), weight 65.8 kg, SpO2 98 %.Body mass index is 20.81 kg/m.  See Md's discharge SRA   Has this patient used any form of tobacco in the last 30 days? (Cigarettes, Smokeless Tobacco, Cigars, and/or Pipes): NA  Blood Alcohol level:  Lab Results  Component Value Date   ETH <10 10/08/2018   ETH <10 54/27/0623   Metabolic Disorder Labs:  Lab Results  Component Value Date   HGBA1C 5.0 10/10/2018   MPG 96.8 10/10/2018   MPG 97 08/01/2016   No results found for: PROLACTIN Lab Results  Component Value Date   CHOL 103 10/10/2018   TRIG 74 10/10/2018   HDL 34 (L) 10/10/2018   CHOLHDL 3.0 10/10/2018   VLDL 15 10/10/2018   LDLCALC 54 10/10/2018   LDLCALC 53 08/01/2016   See Psychiatric Specialty Exam and Suicide Risk Assessment completed by Attending Physician prior to discharge.  Discharge destination:  Home  Is patient on multiple antipsychotic therapies at discharge:  No   Has Patient had three or more failed trials of antipsychotic monotherapy by history:  No  Recommended Plan for Multiple Antipsychotic Therapies: NA  Allergies as of 10/17/2018   No Known Allergies     Medication List     STOP taking these medications   benztropine 1 MG tablet Commonly known as: COGENTIN   divalproex 500 MG 24 hr tablet Commonly known as: DEPAKOTE ER   hydrOXYzine 25 MG tablet Commonly known as: ATARAX/VISTARIL   hydrOXYzine 50 MG tablet Commonly known as: ATARAX/VISTARIL   nicotine 21 mg/24hr patch Commonly known as: NICODERM CQ - dosed in mg/24 hours   risperiDONE 3 MG tablet Commonly known as: RISPERDAL   traZODone 150 MG tablet Commonly known as: DESYREL     TAKE these medications     Indication  ARIPiprazole ER 400 MG Srer injection Commonly known as: ABILIFY MAINTENA Inject 2 mLs (400 mg total) into the muscle every 28 (twenty-eight) days.  Indication: MIXED BIPOLAR AFFECTIVE DISORDER   lithium carbonate  450 MG CR tablet Commonly known as: ESKALITH Take 1 tablet (450 mg total) by mouth every 12 (twelve) hours.  Indication: Manic-Depression   neomycin-polymyxin-hydrocortisone 3.5-10000-1 OTIC suspension Commonly known as: CORTISPORIN Place 4 drops into the right ear every 6 (six) hours.  Indication: Outer Ear Canal Infection   OLANZapine 15 MG tablet Commonly known as: ZYPREXA Take 1 tablet (15 mg total) by mouth 2 (two) times daily.  Indication: Manic Phase of Manic-Depression   Oxcarbazepine 300 MG tablet Commonly known as: TRILEPTAL 1 in am 2 at hs  Indication: Focal Epilepsy   temazepam 30 MG capsule Commonly known as: RESTORIL Take 2 capsules (60 mg total) by mouth at bedtime.  Indication: Trouble Sleeping      Follow-up Information    [facility] Follow up on 10/18/2018.   Why: Medication management appointment with Fransisca KaufmannLaura Davis is Thursday, 10/8 at 11:00a.    Therapy with Jeryl ColumbiaGreg Moore is Thursday, 10/8 at 5:00p.  Appts will be virtual and the providers will contact you.  Contact information: [address] [place] [address] [phone]          Follow-up recommendations: Activity:  As tolerated Diet: As recommended  by your primary care doctor. Keep all scheduled follow-up appointments as recommended.   Comments: Patient is instructed prior to discharge to: Take all medications as prescribed by his/her mental healthcare provider. Report any adverse effects and or reactions from the medicines to his/her outpatient provider promptly. Patient has been instructed & cautioned: To not engage in alcohol and or illegal drug use while on prescription medicines. In the event of worsening symptoms, patient is instructed to call the crisis hotline, 911 and or go to the nearest ED for appropriate evaluation and treatment of symptoms. To follow-up with his/her primary care provider for your other medical issues, concerns and or health care needs.   Signed: Armandina StammerAgnes River Ambrosio, NP, PMHNP, FNP-BC 10/17/2018, 1:28 PM

## 2018-10-17 NOTE — Progress Notes (Signed)
D: Pt denies SI/HI/AV. Pt is pleasant and cooperative. Pt continues to be intrusive , but is redirectable . Pt wants to talk with writer due to conversations during previous admission , pt stated that helped him a lot.  A: Pt was offered support and encouragement. Pt was given scheduled medications. Pt was encourage to attend groups. Q 15 minute checks were done for safety.  R: safety maintained on unit.

## 2018-10-17 NOTE — Progress Notes (Signed)
Recreation Therapy Notes  Date: 10.7.20 Time: 0930 Location: 400 Hall Dayroom  Group Topic: Communication  Goal Area(s) Addresses:  Patient will effectively communicate with peers in group.  Patient will verbalize benefit of healthy communication. Patient will verbalize positive effect of healthy communication on post d/c goals.  Patient will identify communication techniques that made activity effective for group.   Behavioral Response:  Engaged  Intervention: Drawings, pencils, blank paper   Activity: Geometrical Drawings.  One patient was given a drawing to describe to the group.  The remainder of the group was to draw the picture as it was described to them by the presenter.  The presenter can get as detailed as needed.  The group can not ask any detailed questions.  They can only ask the speaker to repeat themselves.  Education: Communication, Discharge Planning  Education Outcome: Acknowledges understanding/In group clarification offered/Needs additional education.   Clinical Observations/Feedback:  Pt identified eye contact as another element of communication.  Pt also talked about not texting people anymore because he was doing to much of it.  Pt stated he would only call or video chat with people from now own so there would be no misunderstandings.    Victorino Sparrow, LRT/CTRS      Victorino Sparrow A 10/17/2018 10:44 AM

## 2018-10-18 ENCOUNTER — Emergency Department (HOSPITAL_COMMUNITY): Payer: Medicaid Other

## 2018-10-18 ENCOUNTER — Emergency Department (HOSPITAL_COMMUNITY)
Admission: EM | Admit: 2018-10-18 | Discharge: 2018-10-18 | Disposition: A | Discharge status: Home / Self Care | Payer: Medicaid Other | Attending: Emergency Medicine | Admitting: Emergency Medicine

## 2018-10-18 ENCOUNTER — Encounter (HOSPITAL_COMMUNITY): Payer: Self-pay | Admitting: Emergency Medicine

## 2018-10-18 ENCOUNTER — Ambulatory Visit (HOSPITAL_COMMUNITY)
Admission: RE | Admit: 2018-10-18 | Discharge: 2018-10-18 | Disposition: A | Discharge status: Home / Self Care | Payer: No Typology Code available for payment source | Attending: Psychiatry | Admitting: Psychiatry

## 2018-10-18 DIAGNOSIS — W2209XA Striking against other stationary object, initial encounter: Secondary | ICD-10-CM | POA: Insufficient documentation

## 2018-10-18 DIAGNOSIS — Y939 Activity, unspecified: Secondary | ICD-10-CM | POA: Insufficient documentation

## 2018-10-18 DIAGNOSIS — S62314A Displaced fracture of base of fourth metacarpal bone, right hand, initial encounter for closed fracture: Secondary | ICD-10-CM | POA: Insufficient documentation

## 2018-10-18 DIAGNOSIS — Y999 Unspecified external cause status: Secondary | ICD-10-CM | POA: Insufficient documentation

## 2018-10-18 DIAGNOSIS — F319 Bipolar disorder, unspecified: Secondary | ICD-10-CM | POA: Diagnosis not present

## 2018-10-18 DIAGNOSIS — S62316A Displaced fracture of base of fifth metacarpal bone, right hand, initial encounter for closed fracture: Secondary | ICD-10-CM | POA: Insufficient documentation

## 2018-10-18 DIAGNOSIS — F17210 Nicotine dependence, cigarettes, uncomplicated: Secondary | ICD-10-CM | POA: Insufficient documentation

## 2018-10-18 DIAGNOSIS — S6991XA Unspecified injury of right wrist, hand and finger(s), initial encounter: Secondary | ICD-10-CM | POA: Diagnosis present

## 2018-10-18 DIAGNOSIS — S62309A Unspecified fracture of unspecified metacarpal bone, initial encounter for closed fracture: Secondary | ICD-10-CM

## 2018-10-18 DIAGNOSIS — Y929 Unspecified place or not applicable: Secondary | ICD-10-CM | POA: Insufficient documentation

## 2018-10-18 MED ORDER — ONDANSETRON 4 MG PO TBDP
4.0000 mg | ORAL_TABLET | Freq: Once | ORAL | Status: AC
  Administered 2018-10-18: 20:00:00 4 mg via ORAL
  Filled 2018-10-18: qty 1

## 2018-10-18 MED ORDER — OXYCODONE HCL 5 MG PO TABS
5.0000 mg | ORAL_TABLET | Freq: Once | ORAL | Status: AC
  Administered 2018-10-18: 5 mg via ORAL

## 2018-10-18 MED ORDER — OXYCODONE HCL 5 MG PO TABS: 5.0000 mg | ORAL_TABLET | ORAL | 0 refills | Status: AC | PRN

## 2018-10-18 NOTE — ED Provider Notes (Signed)
[facility] Emergency Department Provider Note MRN:  [id number]  Arrival date & time: 10/18/18     Chief Complaint   Hand Injury   History of Present Illness   Mitchell Mckee is a 38 y.o. year-old male with a history of bipolar disorder presenting to the ED with chief complaint of hand injury.  Patient became angry with girlfriend earlier today, punched a wall.  Endorsing moderate to severe pain to the right hand.  Pain is constant, worse with motion or palpation.  Denies any other injuries.  Patient explains his mood is controlled, but is currently anxious about the injury and having to wear a cast.  Denies SI, no HI, no AVH.  Review of Systems  A complete 10 system review of systems was obtained and all systems are negative except as noted in the HPI and PMH.   Patient's Health History    Past Medical History:  Diagnosis Date  . Bipolar 1 disorder ([place])     History reviewed. No pertinent surgical history.  No family history on file.  Social History   Socioeconomic History  . Marital status: Single    Spouse name: Not on file  . Number of children: Not on file  . Years of education: Not on file  . Highest education level: Not on file  Occupational History  . Not on file  Social Needs  . Financial resource strain: Not on file  . Food insecurity    Worry: Not on file    Inability: Not on file  . Transportation needs    Medical: Not on file    Non-medical: Not on file  Tobacco Use  . Smoking status: Current Every Day Smoker    Packs/day: 2.00    Types: Cigarettes  . Smokeless tobacco: Never Used  Substance and Sexual Activity  . Alcohol use: Yes    Alcohol/week: 0.0 standard drinks    Comment: occ; special occ  . Drug use: Yes    Types: Marijuana  . Sexual activity: Yes    Birth control/protection: None  Lifestyle  . Physical activity    Days per week: Not on file    Minutes per session: Not on file  . Stress: Not on file   Relationships  . Social Herbalist on phone: Not on file    Gets together: Not on file    Attends religious service: Not on file    Active member of club or organization: Not on file    Attends meetings of clubs or organizations: Not on file    Relationship status: Not on file  . Intimate partner violence    Fear of current or ex partner: Not on file    Emotionally abused: Not on file    Physically abused: Not on file    Forced sexual activity: Not on file  Other Topics Concern  . Not on file  Social History Narrative  . Not on file     Physical Exam  Vital Signs and Nursing Notes reviewed Vitals:   10/18/18 1618  BP: 97/65  Pulse: (!) 106  Resp: 20  Temp: 98.9 F (37.2 C)  SpO2: 99%    CONSTITUTIONAL: Well-appearing, NAD NEURO:  Alert and oriented x 3, no focal deficits EYES:  eyes equal and reactive ENT/NECK:  no LAD, no JVD CARDIO: Regular rate, well-perfused, normal S1 and S2 PULM:  CTAB no wheezing or rhonchi GI/GU:  normal bowel sounds, non-distended, non-tender MSK/SPINE: Edema  and bruising to the right fourth and fifth MCP joints, neurovascular intact distally. SKIN:  no rash, atraumatic PSYCH:  Appropriate speech and behavior  Diagnostic and Interventional Summary    Labs Reviewed - No data to display  DG Hand Complete Right  Final Result      Medications  oxyCODONE (Oxy IR/ROXICODONE) immediate release tablet 5 mg (5 mg Oral Given 10/18/18 2012)  ondansetron (ZOFRAN-ODT) disintegrating tablet 4 mg (4 mg Oral Given 10/18/18 2012)     Procedures Critical Care  ED Course and Medical Decision Making  I have reviewed the triage vital signs and the nursing notes.  Pertinent labs & imaging results that were available during my care of the patient were reviewed by me and considered in my medical decision making (see below for details).  Displaced distal metacarpal fractures, will consult hand surgery to ensure good follow-up.  Discussed with  Dr. Roney Mans, who will see patient in the office early next week.  Ulnar gutter splint applied, good capillary refill, appropriate for discharge.  Elmer Sow. Pilar Plate, MD [facility] Emergency Medicine [facility] [email or web address]  Final Clinical Impressions(s) / ED Diagnoses     ICD-10-CM   1. Closed fracture of metacarpal bone, unspecified fracture morphology, unspecified metacarpal, unspecified portion of metacarpal, initial encounter  S62.309A     ED Discharge Orders         Ordered    oxyCODONE (ROXICODONE) 5 MG immediate release tablet  Every 4 hours PRN     10/18/18 2049          Discharge Instructions Discussed with and Provided to Patient:   Discharge Instructions     You were evaluated in the Emergency Department and after careful evaluation, we did not find any emergent condition requiring admission or further testing in the hospital.  Your x-rays have revealed 2 broken bones in your hand.  Please follow-up with Dr. Roney Mans of hand surgery.  Please call the office tomorrow morning to schedule an appointment.  You should be seen Tuesday or Wednesday.  Please return to the Emergency Department if you experience any worsening of your condition.  We encourage you to follow up with a primary care provider.  Thank you for allowing [place] to be a part of your care.       Sabas Sous, MD 10/18/18 2052

## 2018-10-18 NOTE — Discharge Instructions (Signed)
You were evaluated in the Emergency Department and after careful evaluation, we did not find any emergent condition requiring admission or further testing in the hospital.  Your x-rays have revealed 2 broken bones in your hand.  Please follow-up with Dr. Jeannie Fend of hand surgery.  Please call the office tomorrow morning to schedule an appointment.  You should be seen Tuesday or Wednesday.  Please return to the Emergency Department if you experience any worsening of your condition.  We encourage you to follow up with a primary care provider.  Thank you for allowing [place] to be a part of your care.

## 2018-10-18 NOTE — ED Triage Notes (Signed)
Patient reports right hand pain after punching a wall. Swelling noted to right hand.

## 2018-10-18 NOTE — ED Notes (Signed)
Called for XR x1 with no answer.

## 2019-04-04 ENCOUNTER — Ambulatory Visit: Payer: Medicaid Other | Attending: Internal Medicine

## 2019-04-04 DIAGNOSIS — Z23 Encounter for immunization: Secondary | ICD-10-CM

## 2019-04-04 NOTE — Progress Notes (Signed)
   Covid-19 Vaccination Clinic  Name:  Mitchell Mckee    MRN: [id number] DOB: 1980-04-14  04/04/2019  Mitchell Mckee was observed post Covid-19 immunization for 15 minutes without incident. He was provided with Vaccine Information Sheet and instruction to access the V-Safe system.   Mitchell Mckee was instructed to call 911 with any severe reactions post vaccine: Marland Kitchen Difficulty breathing  . Swelling of face and throat  . A fast heartbeat  . A bad rash all over body  . Dizziness and weakness   Immunizations Administered    Name Date Dose VIS Date Route   Pfizer COVID-19 Vaccine 04/04/2019 12:38 PM 0.3 mL 12/21/2018 Intramuscular   Manufacturer: [employer], [employer]   Lot: [id number]   NDC: [address]-5

## 2019-04-29 ENCOUNTER — Ambulatory Visit: Payer: Medicaid Other | Attending: Internal Medicine

## 2019-04-29 DIAGNOSIS — Z23 Encounter for immunization: Secondary | ICD-10-CM

## 2019-04-29 NOTE — Progress Notes (Signed)
   Covid-19 Vaccination Clinic  Name:  Gaylin T Gott    MRN: [id number] DOB: 06/19/1980  04/29/2019  Mr. Spates was observed post Covid-19 immunization for 15 minutes without incident. He was provided with Vaccine Information Sheet and instruction to access the V-Safe system.   Mr. Milos was instructed to call 911 with any severe reactions post vaccine: . Difficulty breathing  . Swelling of face and throat  . A fast heartbeat  . A bad rash all over body  . Dizziness and weakness   Immunizations Administered    Name Date Dose VIS Date Route   Pfizer COVID-19 Vaccine 04/29/2019 11:12 AM 0.3 mL 03/06/2018 Intramuscular   Manufacturer: Pfizer, Inc   Lot: [id number]   NDC: [address]-2     

## 2019-04-29 NOTE — Progress Notes (Signed)
   [facility] Vaccination [facility]  Name:  Mitchell Mckee    MRN: [id number] DOB: 1980-12-17  04/29/2019  [name] was observed post Covid-19 immunization for 15 minutes without incident. He was provided with Vaccine Information Sheet and instruction to access the V-Safe system.   [name] was instructed to call 911 with any severe reactions post vaccine: Marland Kitchen Difficulty breathing  . Swelling of face and throat  . A fast heartbeat  . A bad rash all over body  . Dizziness and weakness   Immunizations Administered    Name Date Dose VIS Date Route   Pfizer COVID-19 Vaccine 04/29/2019 11:12 AM 0.3 mL 03/06/2018 Intramuscular   Manufacturer: [employer], [employer]   Lot: [id number]   NDC: [address]-4

## 2021-04-12 IMAGING — CR DG HAND COMPLETE 3+V*R*
3 series · 3 of 3 positions shown · non-contrast
Comparison: None.

CLINICAL DATA: Hand pain after punching a wall.

EXAM:
RIGHT HAND - COMPLETE 3+ VIEW

[x hand pa right]
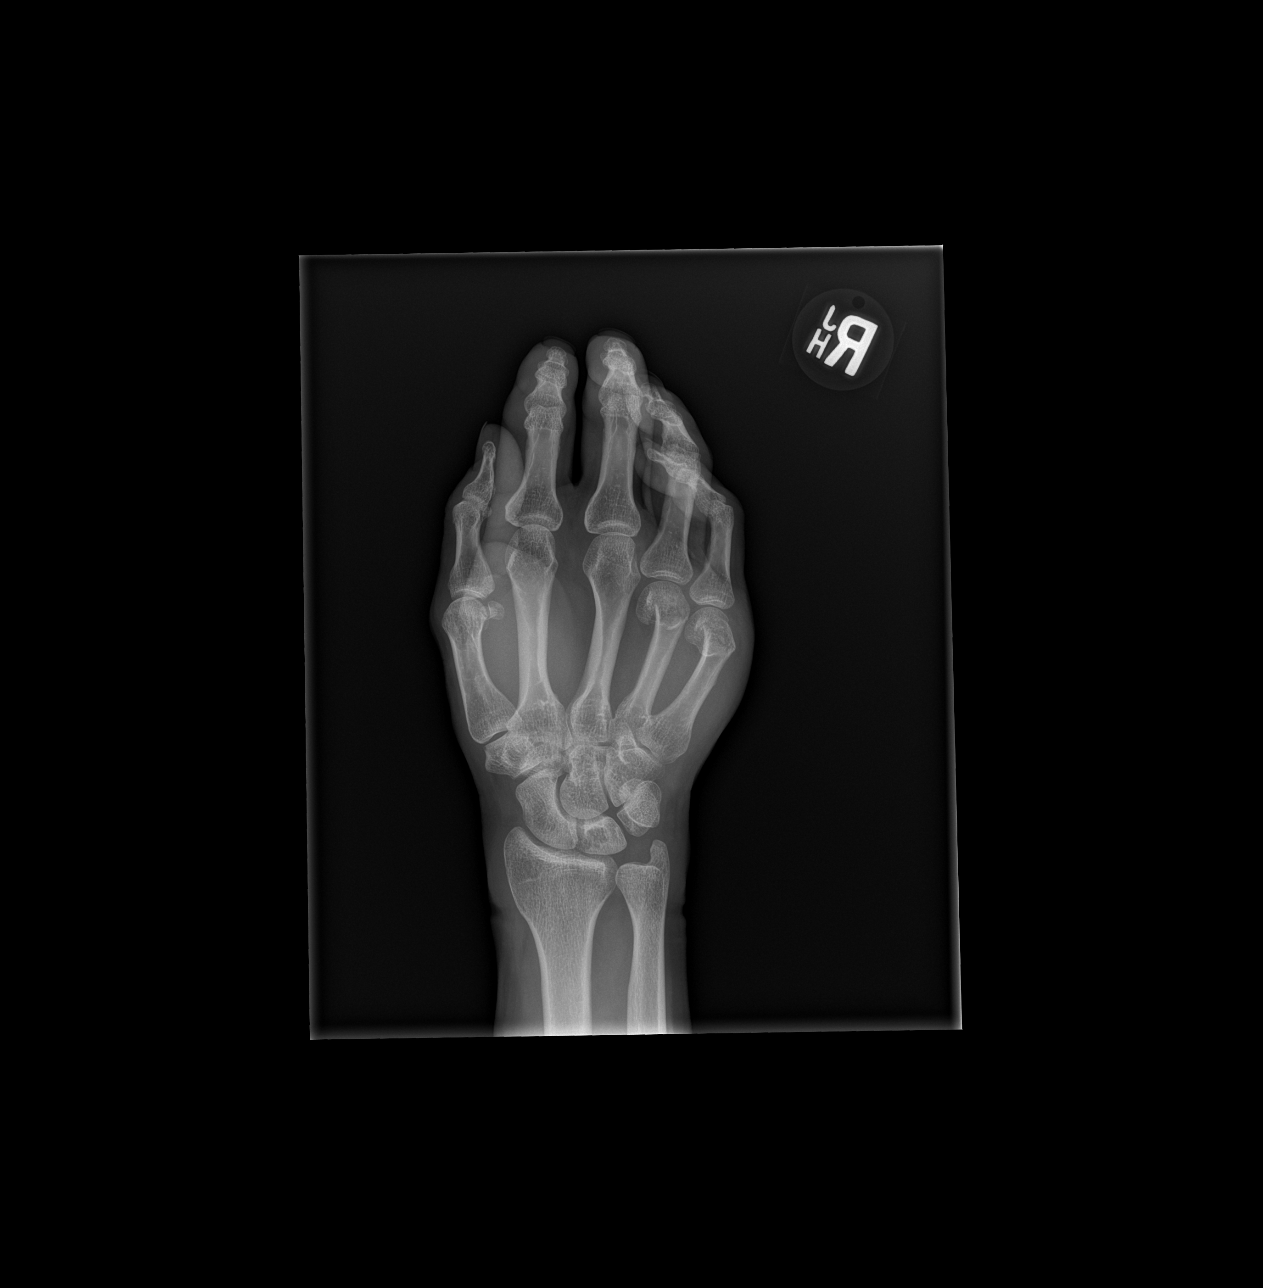

[x hand obl right]
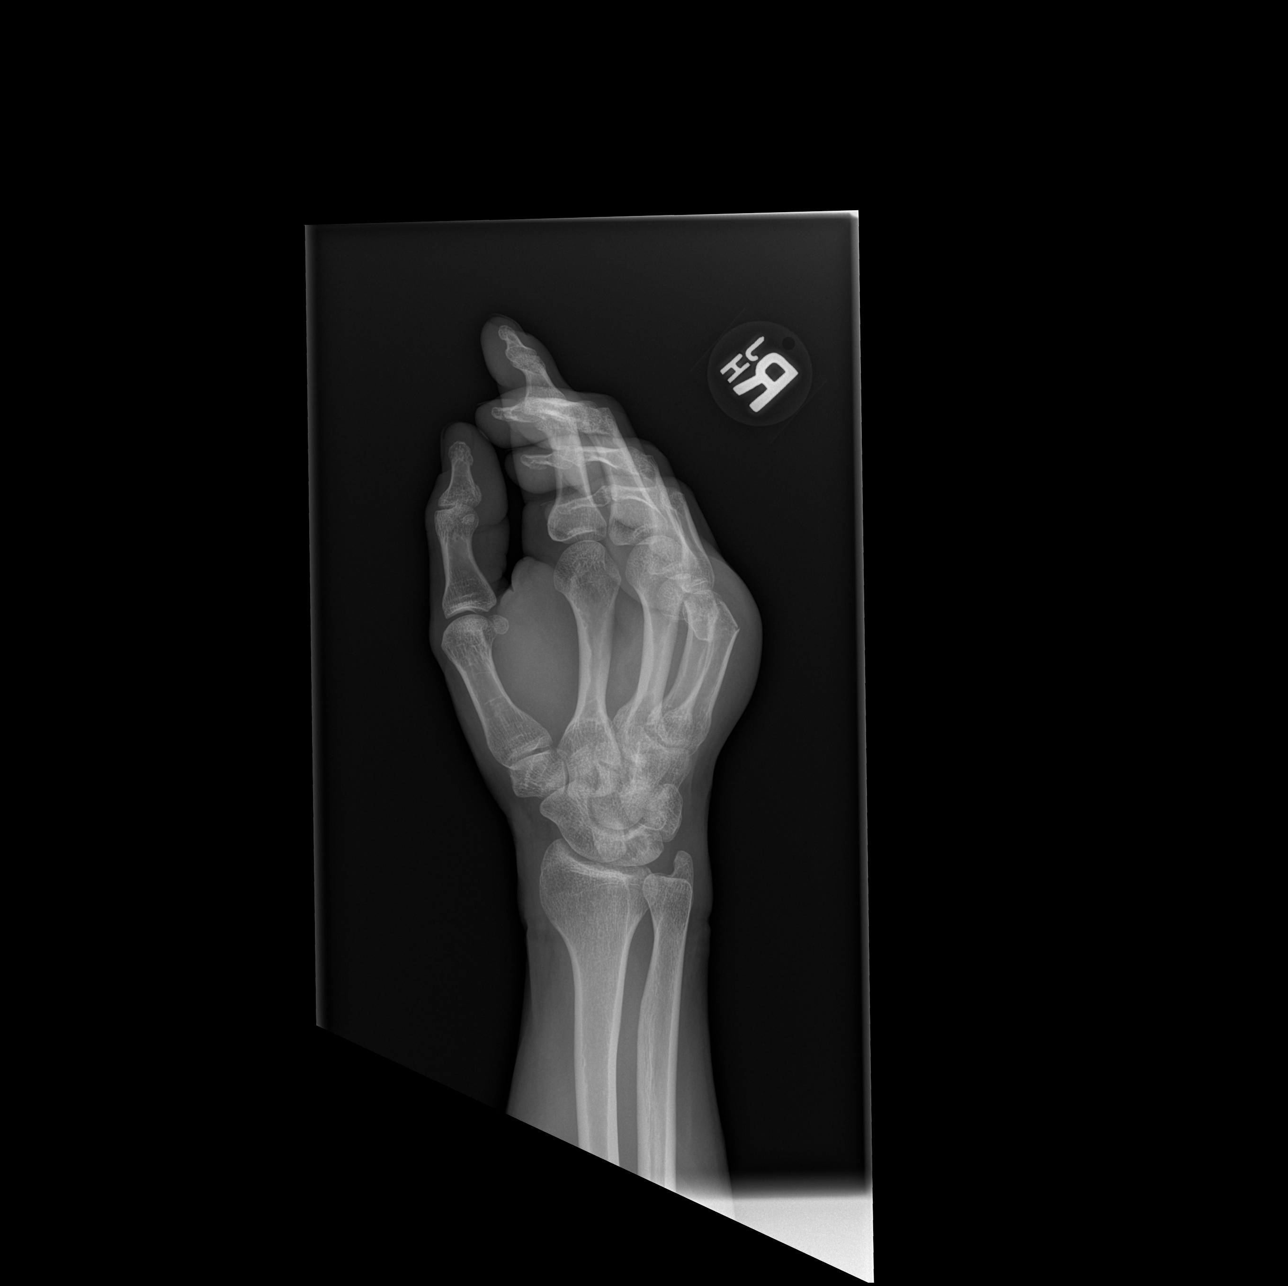

[x hand lat right]
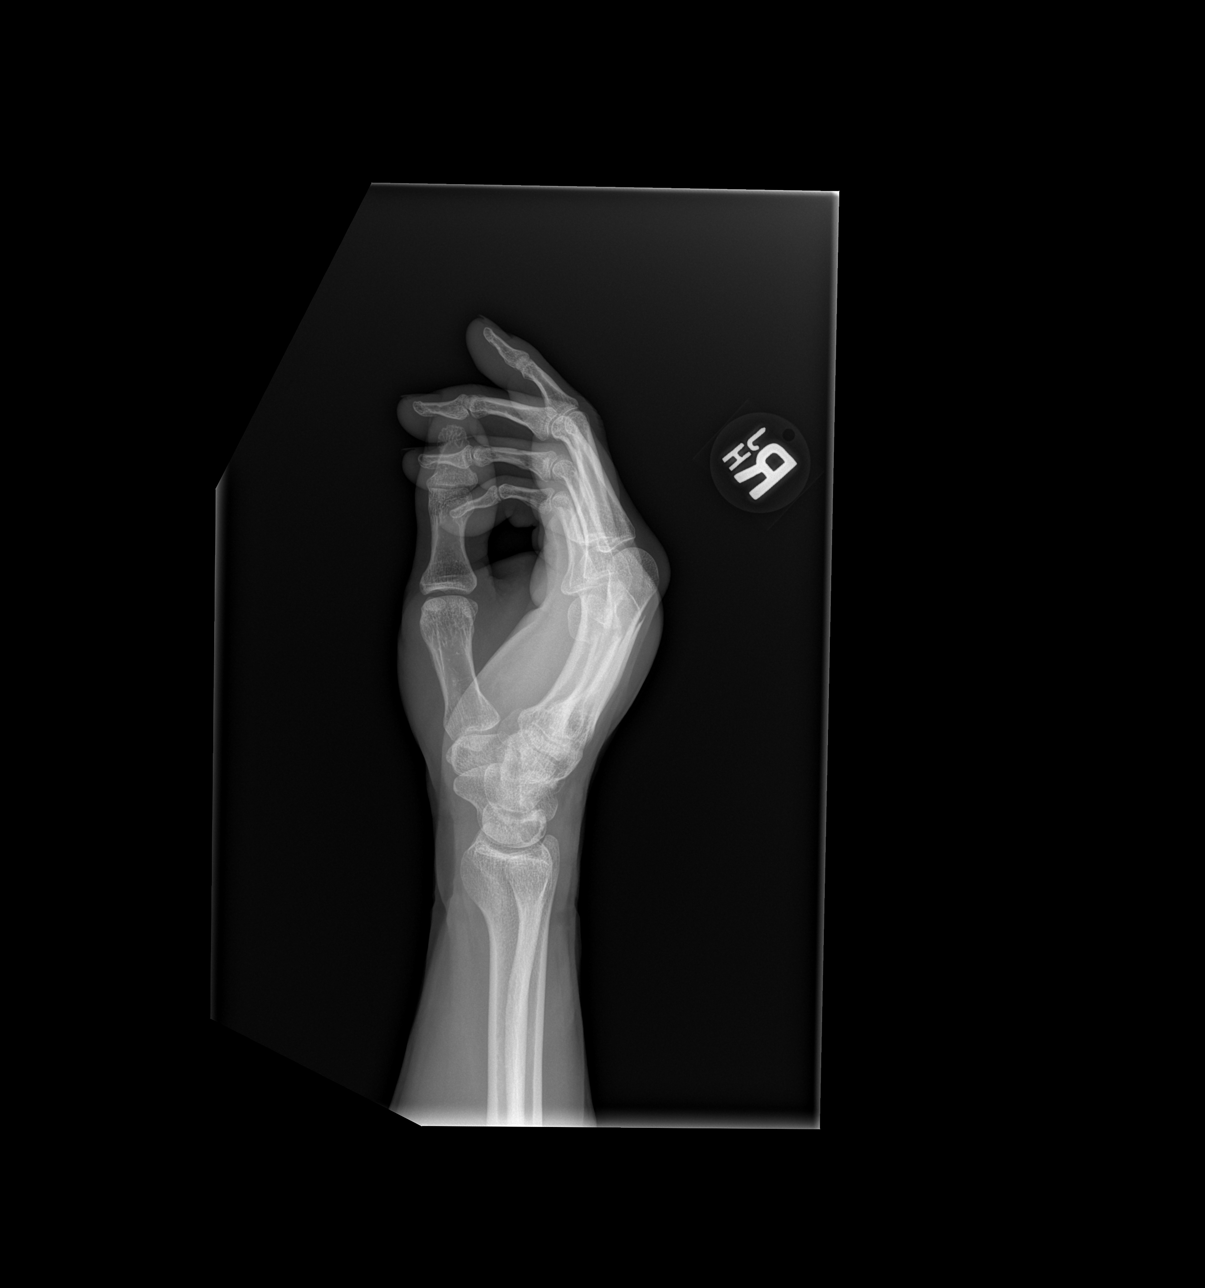

[3 of 3 positions shown; findings below may reference images not displayed]

FINDINGS: Limited exam as the patient was unable to open there hand.
Transverse fractures through the distal aspect of the fourth and
fifth metacarpals with volar displacement of the metacarpal heads.
Overlying soft tissue swelling. No evidence for associated acute
fractures.
IMPRESSION: Transverse fractures through the distal aspect of the fourth and
fifth metacarpals with volar displacement of the metacarpal heads.
Overlying soft tissue swelling.
